# Patient Record
Sex: Male | Born: 1985 | Race: Black or African American | Hispanic: No | Marital: Single | Smoking: Former smoker
Health system: Southern US, Community
[De-identification: ages and names within clinical notes are randomized; demographics above are authoritative.]

## PROBLEM LIST (undated history)

## (undated) DIAGNOSIS — J9383 Other pneumothorax: Secondary | ICD-10-CM

## (undated) HISTORY — DX: Other pneumothorax: J93.83

---
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
## 2007-09-13 ENCOUNTER — Emergency Department: Payer: Self-pay

## 2009-03-10 ENCOUNTER — Emergency Department: Payer: Self-pay | Admitting: Emergency Medicine

## 2009-03-18 ENCOUNTER — Emergency Department: Payer: Self-pay | Admitting: Emergency Medicine

## 2014-01-14 ENCOUNTER — Emergency Department: Payer: Self-pay | Admitting: Emergency Medicine

## 2017-01-17 ENCOUNTER — Emergency Department: Payer: Self-pay

## 2017-01-17 ENCOUNTER — Observation Stay: Payer: Self-pay

## 2017-01-17 ENCOUNTER — Observation Stay
Admission: EM | Admit: 2017-01-17 | Discharge: 2017-01-18 | Disposition: A | Discharge status: Home / Self Care | Payer: Self-pay | Attending: Cardiothoracic Surgery | Admitting: Cardiothoracic Surgery

## 2017-01-17 DIAGNOSIS — J9383 Other pneumothorax: Secondary | ICD-10-CM | POA: Diagnosis present

## 2017-01-17 DIAGNOSIS — J9311 Primary spontaneous pneumothorax: Principal | ICD-10-CM | POA: Insufficient documentation

## 2017-01-17 DIAGNOSIS — F17210 Nicotine dependence, cigarettes, uncomplicated: Secondary | ICD-10-CM | POA: Insufficient documentation

## 2017-01-17 DIAGNOSIS — Z09 Encounter for follow-up examination after completed treatment for conditions other than malignant neoplasm: Secondary | ICD-10-CM

## 2017-01-17 LAB — BASIC METABOLIC PANEL
Anion gap: 6 (ref 5–15)
BUN: 12 mg/dL (ref 6–20)
CALCIUM: 9.2 mg/dL (ref 8.9–10.3)
CO2: 29 mmol/L (ref 22–32)
CREATININE: 1.37 mg/dL — AB (ref 0.61–1.24)
Chloride: 104 mmol/L (ref 101–111)
GFR calc non Af Amer: >60 mL/min (ref >60)
Glucose, Bld: 104 mg/dL — ABNORMAL HIGH (ref 65–99)
Potassium: 4.1 mmol/L (ref 3.5–5.1)
SODIUM: 139 mmol/L (ref 135–145)

## 2017-01-17 LAB — CBC WITH DIFFERENTIAL/PLATELET
BASOS ABS: 0 10*3/uL (ref 0–0.1)
BASOS PCT: 1 %
EOS ABS: 0 10*3/uL (ref 0–0.7)
Eosinophils Relative: 0 %
HCT: 43.7 % (ref 40.0–52.0)
Hemoglobin: 14.9 g/dL (ref 13.0–18.0)
Lymphocytes Relative: 34 %
Lymphs Abs: 2 10*3/uL (ref 1.0–3.6)
MCH: 27.6 pg (ref 26.0–34.0)
MCHC: 34 g/dL (ref 32.0–36.0)
MCV: 81.3 fL (ref 80.0–100.0)
MONO ABS: 0.4 10*3/uL (ref 0.2–1.0)
MONOS PCT: 6 %
NEUTROS ABS: 3.4 10*3/uL (ref 1.4–6.5)
Neutrophils Relative %: 59 %
PLATELETS: 204 10*3/uL (ref 150–440)
RBC: 5.37 MIL/uL (ref 4.40–5.90)
RDW: 12.6 % (ref 11.5–14.5)
WBC: 5.8 10*3/uL (ref 3.8–10.6)

## 2017-01-17 LAB — TROPONIN I

## 2017-01-17 MED ORDER — ACETAMINOPHEN 500 MG PO TABS
1000.0000 mg | ORAL_TABLET | Freq: Four times a day (QID) | ORAL | Status: DC | PRN
  Administered 2017-01-17: 1000 mg via ORAL
  Filled 2017-01-17: qty 2

## 2017-01-17 MED ORDER — HYDROCODONE-ACETAMINOPHEN 5-325 MG PO TABS
1.0000 | ORAL_TABLET | ORAL | Status: DC | PRN
  Filled 2017-01-17: qty 1

## 2017-01-17 MED ORDER — ACETAMINOPHEN 500 MG PO TABS
1000.0000 mg | ORAL_TABLET | Freq: Once | ORAL | Status: AC
  Administered 2017-01-17: 1000 mg via ORAL
  Filled 2017-01-17: qty 2

## 2017-01-17 MED ORDER — KETOROLAC TROMETHAMINE 30 MG/ML IJ SOLN
15.0000 mg | Freq: Once | INTRAMUSCULAR | Status: AC
  Administered 2017-01-17: 15 mg via INTRAVENOUS
  Filled 2017-01-17: qty 1

## 2017-01-17 NOTE — ED Triage Notes (Signed)
Pt c/o left side chest pain since yesterday, states worse with movement and deep breathing, states he did lift and carry heavy coolers to the pool on Sunday..Marland Kitchen

## 2017-01-17 NOTE — ED Notes (Signed)
Pt family brought pt chickfila. Pt denies any needs at this time.

## 2017-01-17 NOTE — ED Notes (Signed)
Admitting at bedside 

## 2017-01-17 NOTE — ED Notes (Signed)
Pt returned from xray

## 2017-01-17 NOTE — ED Notes (Addendum)
Pt to ed with c/o chest pain that started yesterday. Pt reports pain is constant worse with movement and deep breath, left side of chest, mild sob, mild radiation to back.  MD at bedside.  Pt alert and oriented, on CM, IV started labs drawn and sent.  Pt skin warm and dry.  Rates pain 6/10 sitting on stretcher.  Pt sr on monitor.  Lungs clear to auscultation bilat.

## 2017-01-17 NOTE — ED Notes (Signed)
Pt taken to xray via stretcher  

## 2017-01-17 NOTE — ED Provider Notes (Signed)
[facility] Emergency Department Provider Note  ____________________________________________  Time seen: Approximately 9:43 AM  I have reviewed the triage vital signs and the nursing notes.   HISTORY  Chief Complaint Chest Pain   HPI Joel Garner is a 31 y.o. male no significant past medical history who presents for evaluation of left-sided chest pain. Patient reports 2 days ago he was carrying heavy coolers to and from the pool. Yesterday he woke up with left-sided chest pain that he describes as a muscle soreness, mild at rest and worse with movement of his chest wall palpation, constant and nonradiating. No shortness of breath, no URI symptoms, no fever, no dizziness, no nausea or vomiting, no abdominal pain. No personal or family history blood clots, no personal or family history of ischemic heart disease. No recent travel or immobilization, no leg pain or swelling, no hormones. Patient is a smoker. Patient uses marijuana. Patient endorses 1 beer every other day. No other drugs. Has never had a stress test.  No past medical history on file.  Patient Active Problem List   Diagnosis Date Noted  . Spontaneous pneumothorax 01/17/2017    History reviewed. No pertinent surgical history.  Prior to Admission medications   Not on File    Allergies Patient has no known allergies.  No family history on file.  Social History Social History  Substance Use Topics  . Smoking status: Current Every Day Smoker  . Smokeless tobacco: Never Used  . Alcohol use Yes    Review of Systems  Constitutional: Negative for fever. Eyes: Negative for visual changes. ENT: Negative for sore throat. Neck: No neck pain  Cardiovascular: + chest pain. Respiratory: Negative for shortness of breath. Gastrointestinal: Negative for abdominal pain, vomiting or diarrhea. Genitourinary: Negative for dysuria. Musculoskeletal: Negative for back pain. Skin: Negative for  rash. Neurological: Negative for headaches, weakness or numbness. Psych: No SI or HI  ____________________________________________   PHYSICAL EXAM:  VITAL SIGNS: ED Triage Vitals  Enc Vitals Group     BP 01/17/17 0910 136/82     Pulse Rate 01/17/17 0910 81     Resp 01/17/17 0910 17     Temp 01/17/17 0910 98.6 F (37 C)     Temp Source 01/17/17 0910 Oral     SpO2 01/17/17 0910 99 %     Weight 01/17/17 0910 210 lb (95.3 kg)     Height 01/17/17 0910 6\' 1"  (1.854 m)     Head Circumference --      Peak Flow --      Pain Score 01/17/17 0909 7     Pain Loc --      Pain Edu? --      Excl. in GC? --     Constitutional: Alert and oriented. Well appearing and in no apparent distress. HEENT:      Head: Normocephalic and atraumatic.         Eyes: Conjunctivae are normal. Sclera is non-icteric.       Mouth/Throat: Mucous membranes are moist.       Neck: Supple with no signs of meningismus. Cardiovascular: Regular rate and rhythm. No murmurs, gallops, or rubs. 2+ symmetrical distal pulses are present in all extremities. No JVD. ttp over the L lower chest wall with palpation which reproduces the pain Respiratory: Normal respiratory effort. Lungs are clear to auscultation bilaterally. No wheezes, crackles, or rhonchi.  Gastrointestinal: Soft, non tender, and non distended with positive bowel sounds. No rebound or guarding. Genitourinary:  No CVA tenderness. Musculoskeletal: Nontender with normal range of motion in all extremities. No edema, cyanosis, or erythema of extremities. Neurologic: Normal speech and language. Face is symmetric. Moving all extremities. No gross focal neurologic deficits are appreciated. Skin: Skin is warm, dry and intact. No rash noted. Psychiatric: Mood and affect are normal. Speech and behavior are normal.  ____________________________________________   LABS (all labs ordered are listed, but only abnormal results are displayed)  Labs Reviewed  BASIC METABOLIC  PANEL - Abnormal; Notable for the following:       Result Value   Glucose, Bld 104 (*)    Creatinine, Ser 1.37 (*)    All other components within normal limits  CBC WITH DIFFERENTIAL/PLATELET  TROPONIN I  HIV ANTIBODY (ROUTINE TESTING)   ____________________________________________  EKG  ED ECG REPORT I, Nita Sicklearolina Jordell Outten, the attending physician, personally viewed and interpreted this ECG.  Normal sinus rhythm, rate of 83, normal intervals, normal axis, no ST elevations or depressions, flattening in aVL. No prior for comparison  ____________________________________________  RADIOLOGY  CXR: Left apical and apicolateral pneumothorax without tension component. No edema or consolidation. Cardiac silhouette within normal limits. ____________________________________________   PROCEDURES  Procedure(s) performed: None Procedures Critical Care performed:  None ____________________________________________   INITIAL IMPRESSION / ASSESSMENT AND PLAN / ED COURSE   31 y.o. male no significant past medical history who presents for evaluation of left-sided chest pain that he describes as muscle soreness, worse with palpation of the chest wall or movement of the chest wall that has been present for the last 24 hours constantly in the setting of carrying heavy coolers today before to and from the pool. Presentation for atypical chest pain which is reproducible on palpation. Heart score of 1 for smoking. EKG with no ischemic changes. PERC negative. We'll send patient for chest x-ray and get troponin 1 since pain has been constant for a day. This sounds to me like MSK pain based on history and exam therefore will treat with Toradol and Tylenol. Also in the ddx PTX, PNA (although no cough or fever).    _________________________ 9:59 AM on 01/17/2017 -----------------------------------------  Chest x-ray concerning for 15% pneumothorax on the left with no tension component. Patient is  hemodynamically stable, satting 98% on room air in no respiratory distress. At this time we'll attempt oxygen and repeat chest x-ray in 3 hours. The patient becomes distressed or PTX worsens in repeat CXR will put the chest tube.  Pertinent labs & imaging results that were available during my care of the patient were reviewed by me and considered in my medical decision making (see chart for details).    ____________________________________________   FINAL CLINICAL IMPRESSION(S) / ED DIAGNOSES  Final diagnoses:  Primary spontaneous pneumothorax      NEW MEDICATIONS STARTED DURING THIS VISIT:  There are no discharge medications for this patient.    Note:  This document was prepared using Dragon voice recognition software and may include unintentional dictation errors.    Don PerkingVeronese, [place], MD 01/17/17 [phone]

## 2017-01-17 NOTE — H&P (Signed)
Patient ID: Joel Garner, male   DOB: June 11, 1986, 31 y.o.   MRN: [id number]  Chief Complaint  Patient presents with  . Chest Pain    HPI Joel Garner is a 31 y.o. male.  He states that yesterday morning he awoke from sleep and felt an uncomfortable feeling in his left chest. He describes this as a burning sensation that he thought might of been associated with some reflux. The pain wasn't significant and he was able to work through it throughout the day. Yesterday evening he went to bed but when he awoke this morning he again felt the discomfort in his left chest. He went to work. While at work thought he should come to the emergency room to have it checked. Upon arrival here he had a chest x-ray made which showed a small pneumothorax. A repeat chest x-ray made several hours later showed no change in the size of the pneumothorax. I was asked to see the patient for admission for observation for management. He currently states that he feels quite well. He is relieved of the diagnosis and believes that this may been responsible for some of his pain. He's not really short of breath at this time. He has never had this experience before and there is no family history of any lung disease. He does not take any medications and has no allergies. He does smoke a pack cigarettes every 3 days. He also smokes marijuana on a daily basis.     No past medical history on file.  History reviewed. No pertinent surgical history.  No family history on file.  Social History Social History  Substance Use Topics  . Smoking status: Current Every Day Smoker  . Smokeless tobacco: Never Used  . Alcohol use Yes    No Known Allergies  No current facility-administered medications for this encounter.    No current outpatient prescriptions on file.     A complete review of systems was asked and was negative except for the following positive findingsChest pain  Blood pressure 124/79, pulse 60, temperature 98.6 F  (37 C), temperature source Oral, resp. rate 20, height 6\' 1"  (1.854 m), weight 210 lb (95.3 kg), SpO2 100 %.  Physical Exam CONSTITUTIONAL:  Pleasant, well-developed, well-nourished, and in no acute distress. EYES: Pupils equal and reactive to light, Sclera non-icteric EARS, NOSE, MOUTH AND THROAT:  The oropharynx was clear.  Dentition is good repair.  Oral mucosa pink and moist. LYMPH NODES:  Lymph nodes in the neck and axillae were normal RESPIRATORY:  Lungs were clear bilaterally but slightly diminished on the left..  Normal respiratory effort without pathologic use of accessory muscles of respiration CARDIOVASCULAR: Heart was regular without murmurs.  There were no carotid bruits. GI: The abdomen was soft, nontender, and nondistended. There were no palpable masses. There was no hepatosplenomegaly. There were normal bowel sounds in all quadrants. GU:   MUSCULOSKELETAL:  Normal muscle strength and tone.  No clubbing or cyanosis.   SKIN:  There were no pathologic skin lesions.  There were no nodules on palpation. NEUROLOGIC:  Sensation is normal.  Cranial nerves are grossly intact. PSYCH:  Oriented to person, place and time.  Mood and affect are normal.  Data Reviewed Chest x-rays  I have personally reviewed the patient's imaging and medical records.    Assessment    Left sided spontaneous pneumothorax. I reviewed his films with Dr. Maisie Fus register in interventional radiology. There does not appear to be any significant difference between  the 2 films that were obtained. I reviewed with the patient and his father the indications and risks of chest tube insertion versus observation only. They would like to observe at this time.    Plan    We will admit the patient to the hospital for observation. He will have another chest x-ray made later this evening. We will keep him on oxygen therapy.      Hulda Marinimothy Dimond Crotty, MD 01/17/2017, 1:22 PM

## 2017-01-18 ENCOUNTER — Observation Stay: Payer: Self-pay

## 2017-01-18 ENCOUNTER — Telehealth: Payer: Self-pay

## 2017-01-18 ENCOUNTER — Other Ambulatory Visit: Payer: Self-pay

## 2017-01-18 DIAGNOSIS — J9383 Other pneumothorax: Secondary | ICD-10-CM

## 2017-01-18 NOTE — Telephone Encounter (Signed)
Patients father called back, I relayed this information to his father for him to give the information to his son.

## 2017-01-18 NOTE — Care Management (Signed)
Patient admitted with spontaneous left sided pneumothorax.  Patient listed as self pay patient.  Patient states that he does have insurance through his employer, but does not have his card with him.  Patient to [facility] with insurance information to add to file.   No new prescriptions on file for patient at discharge.  PCP Fisher.  No RNCM needs identified.  RNCM signing off

## 2017-01-18 NOTE — Progress Notes (Signed)
MD ordered patient to be discharged home.  Discharge instructions were reviewed with the patient and he voiced understanding.  Follow-up appointment was made.  No prescriptions given to the patient.  IV was removed with catheter intact.  All patients questions were answered.  Patient leaving via wheelchair escorted by auxillary.

## 2017-01-18 NOTE — Final Progress Note (Signed)
  Patient ID: Joel Garner, male   DOB: 27-Jan-1986, 31 y.o.   MRN: [id number]  HISTORY: Minimal discomfort.  Not short of breath.  Oxygen sats 100 %.     Vitals:   01/18/17 0406 01/18/17 0910  BP: (!) 146/81   Pulse: 69 62  Resp: 20   Temp: 98 F (36.7 C)      EXAM:    Resp: Lungs are clear bilaterally.  No respiratory distress, normal effort. Heart:  Regular without murmurs Abd:  Abdomen is soft, non distended and non tender. No masses are palpable.  There is no rebound and no guarding.  Neurological: Alert and oriented to person, place, and time. Coordination normal.  Skin: Skin is warm and dry. No rash noted. No diaphoretic. No erythema. No pallor.  Psychiatric: Normal mood and affect. Normal behavior. Judgment and thought content normal.   Independent review of today's CXRay shows stable to slightly smaller left hydropneumothorax     ASSESSMENT: Left sided spontaneous pneumothorax   PLAN:   Discharge to home today.  No heavy lifting or driving.  Followup in 2 days in the office.  Other discharge instructions given as well.      Hulda Marinimothy Anthony Roland, MD

## 2017-01-18 NOTE — Discharge Summary (Signed)
Physician Discharge Summary  Patient ID: Junious Silkrvin P Koke MRN: [id number] DOB/AGE: 1985-11-09 31 y.o.  Admit date: 01/17/2017 Discharge date: 01/18/2017   Discharge Diagnoses:  Active Problems:   Spontaneous pneumothorax   Procedures: None  Hospital Course: Patient was admitted with a spontaneous left sided pneumothorax.  Serial CXRay and exams were stable.  He was asymptomatic the morning after admission and his CXRay was stable to imporved.  He was discharged to home to followup in 2 days as an outpatient.    Disposition: Final discharge disposition not confirmed  Discharge Instructions    Diet - low sodium heart healthy    Complete by:  As directed    Discharge instructions    Complete by:  As directed    Followup in 2 days.  No lifting greater than 10 pounds and no driving.  Tylenol or Advil for pain.  Return to ER if symptoms develop or worsen.   Increase activity slowly    Complete by:  As directed      Allergies as of 01/18/2017   No Known Allergies     Medication List    You have not been prescribed any medications.      Hulda Marinimothy Dallas Torok, MD

## 2017-01-18 NOTE — Telephone Encounter (Signed)
Called patient and his voicemail is not set-up. I then called patient's father and left him a detailed message to tell his son that we are needing to see him on Friday 01/20/2017 at 8:00 AM with Dr. Thelma Bargeaks. But before that, [facility] to have a chest x-ray done.

## 2017-01-19 LAB — HIV ANTIBODY (ROUTINE TESTING W REFLEX): HIV SCREEN 4TH GENERATION: NONREACTIVE

## 2017-01-20 ENCOUNTER — Encounter: Payer: Self-pay | Admitting: Cardiothoracic Surgery

## 2017-01-20 ENCOUNTER — Ambulatory Visit (INDEPENDENT_AMBULATORY_CARE_PROVIDER_SITE_OTHER): Payer: Self-pay | Admitting: Cardiothoracic Surgery

## 2017-01-20 ENCOUNTER — Ambulatory Visit
Admission: RE | Admit: 2017-01-20 | Discharge: 2017-01-20 | Disposition: A | Discharge status: Home / Self Care | Payer: Self-pay | Source: Ambulatory Visit | Attending: Cardiothoracic Surgery | Admitting: Cardiothoracic Surgery

## 2017-01-20 VITALS — BP 152/80 | HR 74 | Temp 97.7°F | Ht 73.0 in | Wt 213.0 lb

## 2017-01-20 DIAGNOSIS — J939 Pneumothorax, unspecified: Secondary | ICD-10-CM | POA: Insufficient documentation

## 2017-01-20 DIAGNOSIS — J9383 Other pneumothorax: Secondary | ICD-10-CM

## 2017-01-20 DIAGNOSIS — J90 Pleural effusion, not elsewhere classified: Secondary | ICD-10-CM | POA: Insufficient documentation

## 2017-01-20 NOTE — Patient Instructions (Signed)
Please remember not to work at this time until Dr. Thelma Bargeaks releases you.

## 2017-01-20 NOTE — Progress Notes (Signed)
  Patient ID: Joel Garner, male   DOB: 01/05/1986, 31 y.o.   MRN: [id number]  HISTORY: He returns today in follow-up. He was admitted to the hospital for observation following a left spontaneous pneumothorax. He's been using his incentive spirometer without difficulty. He denies any significant pain or shortness of breath. He denies any fevers. He has quit smoking and is now 4 days since his last cigarette.   Vitals:   01/20/17 0810  BP: (!) 152/80  Pulse: 74  Temp: 97.7 F (36.5 C)     EXAM:    Resp: Lungs are clear bilaterally But slightly diminished on the left..  No respiratory distress, normal effort. Heart:  Regular without murmurs Abd:  Abdomen is soft, non distended and non tender. No masses are palpable.  There is no rebound and no guarding.  Neurological: Alert and oriented to person, place, and time. Coordination normal.  Skin: Skin is warm and dry. No rash noted. No diaphoretic. No erythema. No pallor.  Psychiatric: Normal mood and affect. Normal behavior. Judgment and thought content normal.    ASSESSMENT: Left sided spontaneous pneumothorax. I have independently reviewed the patient's films. I do think that the pneumothorax is slightly smaller than upon admission.   PLAN:   I will see the patient back again in one week. We will obtain a chest x-ray at that time. He was instructed to remain tobacco free.    Hulda Marinimothy Maverik Foot, MD

## 2017-01-24 ENCOUNTER — Telehealth: Payer: Self-pay

## 2017-01-24 NOTE — Telephone Encounter (Signed)
Patient stated he received disability paperwork from his employer and that he would bring it here to be completed. Patient advised of 25.00 dollar fee.

## 2017-01-27 ENCOUNTER — Ambulatory Visit (INDEPENDENT_AMBULATORY_CARE_PROVIDER_SITE_OTHER): Payer: Self-pay | Admitting: Cardiothoracic Surgery

## 2017-01-27 ENCOUNTER — Ambulatory Visit
Admission: RE | Admit: 2017-01-27 | Discharge: 2017-01-27 | Disposition: A | Discharge status: Home / Self Care | Payer: Self-pay | Source: Ambulatory Visit | Attending: Cardiothoracic Surgery | Admitting: Cardiothoracic Surgery

## 2017-01-27 ENCOUNTER — Encounter: Payer: Self-pay | Admitting: Cardiothoracic Surgery

## 2017-01-27 VITALS — BP 119/82 | HR 89 | Temp 98.4°F | Resp 14 | Ht 73.0 in | Wt 211.0 lb

## 2017-01-27 DIAGNOSIS — J9383 Other pneumothorax: Secondary | ICD-10-CM

## 2017-01-27 NOTE — Progress Notes (Signed)
  Patient ID: Joel Garner, male   DOB: 09-03-1985, 31 y.o.   MRN: [id number]  HISTORY: He is done well since discharge. His had no complaints of fever or chills. There is been no cough or shortness of breath. He has no chest pain.   Vitals:   01/27/17 0831  BP: 119/82  Pulse: 89  Resp: 14  Temp: 98.4 F (36.9 C)     EXAM:    Resp: Lungs are clear bilaterally.  No respiratory distress, normal effort. Heart:  Regular without murmurs Abd:  Abdomen is soft, non distended and non tender. No masses are palpable.  There is no rebound and no guarding.  Neurological: Alert and oriented to person, place, and time. Coordination normal.  Skin: Skin is warm and dry. No rash noted. No diaphoretic. No erythema. No pallor.  Psychiatric: Normal mood and affect. Normal behavior. Judgment and thought content normal.    ASSESSMENT: Spontaneous pneumothorax left side. I have independently reviewed his chest x-ray. I see no abnormalities on that. The pneumothorax has resolved.   PLAN:   I have explained to the patient the need for tobacco cessation. He understands that.  In addition he knows that there is an approximately 10% risk of a further pneumothorax. He's aware those some now that he has experienced 1 he will know what to look for regarding symptoms. I told him that he could return to work next week. I did not make a return visit form. He will follow-up when necessary.    Hulda Marinimothy Laquita Harlan, MD

## 2017-01-27 NOTE — Patient Instructions (Signed)
Give our office a call if you need to schedule an appointment.

## 2019-03-28 ENCOUNTER — Encounter: Payer: Self-pay | Admitting: Emergency Medicine

## 2019-03-28 ENCOUNTER — Emergency Department: Payer: Self-pay

## 2019-03-28 ENCOUNTER — Emergency Department
Admission: EM | Admit: 2019-03-28 | Discharge: 2019-03-28 | Disposition: A | Discharge status: Home / Self Care | Payer: Self-pay | Attending: Emergency Medicine | Admitting: Emergency Medicine

## 2019-03-28 ENCOUNTER — Encounter (HOSPITAL_COMMUNITY): Payer: Self-pay | Admitting: Emergency Medicine

## 2019-03-28 ENCOUNTER — Other Ambulatory Visit: Payer: Self-pay

## 2019-03-28 ENCOUNTER — Inpatient Hospital Stay (HOSPITAL_COMMUNITY)
Admission: EM | Admit: 2019-03-28 | Discharge: 2019-03-30 | DRG: 494 | Disposition: A | Discharge status: Home / Self Care | Payer: Self-pay | Source: Other Acute Inpatient Hospital | Attending: Orthopedic Surgery | Admitting: Orthopedic Surgery

## 2019-03-28 DIAGNOSIS — Z7289 Other problems related to lifestyle: Secondary | ICD-10-CM | POA: Diagnosis not present

## 2019-03-28 DIAGNOSIS — T148XXA Other injury of unspecified body region, initial encounter: Secondary | ICD-10-CM

## 2019-03-28 DIAGNOSIS — Z87891 Personal history of nicotine dependence: Secondary | ICD-10-CM | POA: Insufficient documentation

## 2019-03-28 DIAGNOSIS — S82871A Displaced pilon fracture of right tibia, initial encounter for closed fracture: Secondary | ICD-10-CM | POA: Diagnosis present

## 2019-03-28 DIAGNOSIS — Z20828 Contact with and (suspected) exposure to other viral communicable diseases: Secondary | ICD-10-CM | POA: Insufficient documentation

## 2019-03-28 DIAGNOSIS — Z419 Encounter for procedure for purposes other than remedying health state, unspecified: Secondary | ICD-10-CM

## 2019-03-28 DIAGNOSIS — Y92828 Other wilderness area as the place of occurrence of the external cause: Secondary | ICD-10-CM | POA: Insufficient documentation

## 2019-03-28 DIAGNOSIS — S92121A Displaced fracture of body of right talus, initial encounter for closed fracture: Secondary | ICD-10-CM | POA: Insufficient documentation

## 2019-03-28 DIAGNOSIS — X509XXA Other and unspecified overexertion or strenuous movements or postures, initial encounter: Secondary | ICD-10-CM | POA: Insufficient documentation

## 2019-03-28 DIAGNOSIS — Y9389 Activity, other specified: Secondary | ICD-10-CM | POA: Insufficient documentation

## 2019-03-28 DIAGNOSIS — Y905 Blood alcohol level of 100-119 mg/100 ml: Secondary | ICD-10-CM | POA: Insufficient documentation

## 2019-03-28 DIAGNOSIS — Y998 Other external cause status: Secondary | ICD-10-CM | POA: Insufficient documentation

## 2019-03-28 DIAGNOSIS — S82851A Displaced trimalleolar fracture of right lower leg, initial encounter for closed fracture: Secondary | ICD-10-CM | POA: Insufficient documentation

## 2019-03-28 DIAGNOSIS — Z8709 Personal history of other diseases of the respiratory system: Secondary | ICD-10-CM

## 2019-03-28 DIAGNOSIS — Y929 Unspecified place or not applicable: Secondary | ICD-10-CM

## 2019-03-28 DIAGNOSIS — F10920 Alcohol use, unspecified with intoxication, uncomplicated: Secondary | ICD-10-CM | POA: Insufficient documentation

## 2019-03-28 LAB — BASIC METABOLIC PANEL
Anion gap: 10 (ref 5–15)
BUN: 9 mg/dL (ref 6–20)
CO2: 26 mmol/L (ref 22–32)
Calcium: 9.3 mg/dL (ref 8.9–10.3)
Chloride: 104 mmol/L (ref 98–111)
Creatinine, Ser: 1.17 mg/dL (ref 0.61–1.24)
GFR calc Af Amer: >60 mL/min (ref >60)
GFR calc non Af Amer: >60 mL/min (ref >60)
Glucose, Bld: 99 mg/dL (ref 70–99)
Potassium: 3.6 mmol/L (ref 3.5–5.1)
Sodium: 140 mmol/L (ref 135–145)

## 2019-03-28 LAB — CBC WITH DIFFERENTIAL/PLATELET
Abs Immature Granulocytes: 0.02 10*3/uL (ref 0.00–0.07)
Basophils Absolute: 0 10*3/uL (ref 0.0–0.1)
Basophils Relative: 0 %
Eosinophils Absolute: 0 10*3/uL (ref 0.0–0.5)
Eosinophils Relative: 0 %
HCT: 43.2 % (ref 39.0–52.0)
Hemoglobin: 14 g/dL (ref 13.0–17.0)
Immature Granulocytes: 0 %
Lymphocytes Relative: 53 %
Lymphs Abs: 4 10*3/uL (ref 0.7–4.0)
MCH: 27.2 pg (ref 26.0–34.0)
MCHC: 32.4 g/dL (ref 30.0–36.0)
MCV: 84 fL (ref 80.0–100.0)
Monocytes Absolute: 0.5 10*3/uL (ref 0.1–1.0)
Monocytes Relative: 7 %
Neutro Abs: 3.1 10*3/uL (ref 1.7–7.7)
Neutrophils Relative %: 40 %
Platelets: 261 10*3/uL (ref 150–400)
RBC: 5.14 MIL/uL (ref 4.22–5.81)
RDW: 12.5 % (ref 11.5–15.5)
WBC: 7.6 10*3/uL (ref 4.0–10.5)
nRBC: 0 % (ref 0.0–0.2)

## 2019-03-28 LAB — ETHANOL: Alcohol, Ethyl (B): 109 mg/dL — ABNORMAL HIGH (ref <10)

## 2019-03-28 LAB — SARS CORONAVIRUS 2 BY RT PCR (HOSPITAL ORDER, PERFORMED IN ~~LOC~~ HOSPITAL LAB): SARS Coronavirus 2: NEGATIVE

## 2019-03-28 MED ORDER — ONDANSETRON HCL 4 MG PO TABS: 4.0000 mg | ORAL_TABLET | Freq: Four times a day (QID) | ORAL | Status: DC | PRN

## 2019-03-28 MED ORDER — HYDROMORPHONE HCL 1 MG/ML IJ SOLN
1.0000 mg | Freq: Once | INTRAMUSCULAR | Status: AC
  Administered 2019-03-28: 1 mg via INTRAVENOUS
  Filled 2019-03-28: qty 1

## 2019-03-28 MED ORDER — ACETAMINOPHEN 325 MG PO TABS: 325.0000 mg | ORAL_TABLET | Freq: Four times a day (QID) | ORAL | Status: DC | PRN

## 2019-03-28 MED ORDER — HYDROCODONE-ACETAMINOPHEN 5-325 MG PO TABS: 1.0000 | ORAL_TABLET | ORAL | Status: DC | PRN

## 2019-03-28 MED ORDER — HYDROMORPHONE HCL 1 MG/ML IJ SOLN
INTRAMUSCULAR | Status: AC
  Administered 2019-03-28: 19:00:00
  Filled 2019-03-28: qty 1

## 2019-03-28 MED ORDER — METHOCARBAMOL 500 MG PO TABS: 500.0000 mg | ORAL_TABLET | Freq: Four times a day (QID) | ORAL | Status: DC | PRN

## 2019-03-28 MED ORDER — HYDROCODONE-ACETAMINOPHEN 7.5-325 MG PO TABS: 1.0000 | ORAL_TABLET | ORAL | Status: DC | PRN

## 2019-03-28 MED ORDER — TRAMADOL HCL 50 MG PO TABS
50.0000 mg | ORAL_TABLET | Freq: Four times a day (QID) | ORAL | Status: DC
  Administered 2019-03-28: 50 mg via ORAL
  Filled 2019-03-28: qty 1

## 2019-03-28 MED ORDER — HYDROMORPHONE HCL 1 MG/ML IJ SOLN: 1.0000 mg | Freq: Once | INTRAMUSCULAR | Status: DC

## 2019-03-28 MED ORDER — MORPHINE SULFATE (PF) 2 MG/ML IV SOLN
2.0000 mg | INTRAVENOUS | Status: DC | PRN
  Administered 2019-03-29 (×2): 2 mg via INTRAVENOUS
  Filled 2019-03-28 (×2): qty 1

## 2019-03-28 MED ORDER — HYDROMORPHONE HCL 1 MG/ML PO LIQD: 1.0000 mg | Freq: Once | ORAL | Status: DC

## 2019-03-28 MED ORDER — ONDANSETRON HCL 4 MG/2ML IJ SOLN: 4.0000 mg | Freq: Four times a day (QID) | INTRAMUSCULAR | Status: DC | PRN

## 2019-03-28 MED ORDER — ACETAMINOPHEN 500 MG PO TABS
500.0000 mg | ORAL_TABLET | Freq: Four times a day (QID) | ORAL | Status: DC
  Administered 2019-03-28: 500 mg via ORAL
  Filled 2019-03-28: qty 1

## 2019-03-28 MED ORDER — METHOCARBAMOL 1000 MG/10ML IJ SOLN: 500.0000 mg | Freq: Four times a day (QID) | INTRAVENOUS | Status: DC | PRN

## 2019-03-28 NOTE — ED Notes (Signed)
Patient transported to CT 
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
## 2019-03-28 NOTE — ED Notes (Signed)
Patient transferring to [facility]; called report to [facility]. ED tech applying splint
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
## 2019-03-29 ENCOUNTER — Encounter (HOSPITAL_COMMUNITY)
Admission: EM | Disposition: A | Discharge status: Home / Self Care | Payer: Self-pay | Source: Home / Self Care | Attending: Orthopedic Surgery

## 2019-03-29 ENCOUNTER — Inpatient Hospital Stay (HOSPITAL_COMMUNITY): Payer: Self-pay | Admitting: Certified Registered"

## 2019-03-29 ENCOUNTER — Encounter (HOSPITAL_COMMUNITY): Payer: Self-pay | Admitting: Surgery

## 2019-03-29 ENCOUNTER — Inpatient Hospital Stay (HOSPITAL_COMMUNITY): Payer: Self-pay

## 2019-03-29 DIAGNOSIS — S82871A Displaced pilon fracture of right tibia, initial encounter for closed fracture: Secondary | ICD-10-CM | POA: Diagnosis present

## 2019-03-29 HISTORY — PX: ORIF ANKLE FRACTURE: SHX5408

## 2019-03-29 LAB — HIV ANTIBODY (ROUTINE TESTING W REFLEX): HIV Screen 4th Generation wRfx: NONREACTIVE

## 2019-03-29 LAB — CBC
HCT: 41.9 % (ref 39.0–52.0)
Hemoglobin: 13.9 g/dL (ref 13.0–17.0)
MCH: 27.9 pg (ref 26.0–34.0)
MCHC: 33.2 g/dL (ref 30.0–36.0)
MCV: 84 fL (ref 80.0–100.0)
Platelets: 231 10*3/uL (ref 150–400)
RBC: 4.99 MIL/uL (ref 4.22–5.81)
RDW: 12.5 % (ref 11.5–15.5)
WBC: 13.8 10*3/uL — ABNORMAL HIGH (ref 4.0–10.5)
nRBC: 0 % (ref 0.0–0.2)

## 2019-03-29 LAB — SURGICAL PCR SCREEN
MRSA, PCR: NEGATIVE
Staphylococcus aureus: NEGATIVE

## 2019-03-29 LAB — CREATININE, SERUM
Creatinine, Ser: 1.13 mg/dL (ref 0.61–1.24)
GFR calc Af Amer: >60 mL/min (ref >60)
GFR calc non Af Amer: >60 mL/min (ref >60)

## 2019-03-29 SURGERY — OPEN REDUCTION INTERNAL FIXATION (ORIF) ANKLE FRACTURE
Anesthesia: General | Site: Ankle | Laterality: Right

## 2019-03-29 MED ORDER — OXYCODONE HCL 5 MG/5ML PO SOLN: 5.0000 mg | Freq: Once | ORAL | Status: AC | PRN

## 2019-03-29 MED ORDER — ONDANSETRON HCL 4 MG/2ML IJ SOLN
INTRAMUSCULAR | Status: AC
  Filled 2019-03-29: qty 2

## 2019-03-29 MED ORDER — ONDANSETRON HCL 4 MG/2ML IJ SOLN: 4.0000 mg | Freq: Once | INTRAMUSCULAR | Status: DC | PRN

## 2019-03-29 MED ORDER — FENTANYL CITRATE (PF) 100 MCG/2ML IJ SOLN: 25.0000 ug | INTRAMUSCULAR | Status: DC | PRN

## 2019-03-29 MED ORDER — ACETAMINOPHEN 10 MG/ML IV SOLN
1000.0000 mg | Freq: Once | INTRAVENOUS | Status: DC | PRN
  Administered 2019-03-29: 14:00:00 1000 mg via INTRAVENOUS

## 2019-03-29 MED ORDER — VITAMIN D3 25 MCG PO TABS: 1000.0000 [IU] | ORAL_TABLET | Freq: Two times a day (BID) | ORAL | 0 refills | Status: DC

## 2019-03-29 MED ORDER — OXYCODONE HCL 5 MG PO TABS
5.0000 mg | ORAL_TABLET | Freq: Once | ORAL | Status: AC | PRN
  Administered 2019-03-29: 5 mg via ORAL

## 2019-03-29 MED ORDER — MIDAZOLAM HCL 2 MG/2ML IJ SOLN
INTRAMUSCULAR | Status: AC
  Filled 2019-03-29: qty 2

## 2019-03-29 MED ORDER — HYDROMORPHONE HCL 1 MG/ML IJ SOLN: 0.5000 mg | INTRAMUSCULAR | Status: DC | PRN

## 2019-03-29 MED ORDER — CEFAZOLIN SODIUM-DEXTROSE 2-4 GM/100ML-% IV SOLN
2.0000 g | Freq: Four times a day (QID) | INTRAVENOUS | Status: AC
  Administered 2019-03-29 – 2019-03-30 (×3): 2 g via INTRAVENOUS
  Filled 2019-03-29 (×3): qty 100

## 2019-03-29 MED ORDER — VITAMIN D 25 MCG (1000 UNIT) PO TABS
2000.0000 [IU] | ORAL_TABLET | Freq: Two times a day (BID) | ORAL | Status: DC
  Administered 2019-03-29 – 2019-03-30 (×3): 2000 [IU] via ORAL
  Filled 2019-03-29 (×3): qty 2

## 2019-03-29 MED ORDER — HYDROMORPHONE HCL 1 MG/ML IJ SOLN
0.2500 mg | INTRAMUSCULAR | Status: DC | PRN
  Administered 2019-03-29 (×2): 0.5 mg via INTRAVENOUS

## 2019-03-29 MED ORDER — PROPOFOL 10 MG/ML IV BOLUS
INTRAVENOUS | Status: DC | PRN
  Administered 2019-03-29: 200 mg via INTRAVENOUS

## 2019-03-29 MED ORDER — METHOCARBAMOL 1000 MG/10ML IJ SOLN
500.0000 mg | Freq: Four times a day (QID) | INTRAVENOUS | Status: DC
  Filled 2019-03-29 (×8): qty 5

## 2019-03-29 MED ORDER — VITAMIN C 500 MG PO TABS
1000.0000 mg | ORAL_TABLET | Freq: Every day | ORAL | Status: DC
  Administered 2019-03-29 – 2019-03-30 (×2): 1000 mg via ORAL
  Filled 2019-03-29 (×2): qty 2

## 2019-03-29 MED ORDER — LABETALOL HCL 5 MG/ML IV SOLN: 15.0000 mg | INTRAVENOUS | Status: DC | PRN

## 2019-03-29 MED ORDER — ACETAMINOPHEN 500 MG PO TABS
500.0000 mg | ORAL_TABLET | Freq: Two times a day (BID) | ORAL | Status: DC
  Administered 2019-03-29 – 2019-03-30 (×2): 500 mg via ORAL
  Filled 2019-03-29 (×2): qty 1

## 2019-03-29 MED ORDER — FENTANYL CITRATE (PF) 250 MCG/5ML IJ SOLN
INTRAMUSCULAR | Status: AC
  Filled 2019-03-29: qty 5

## 2019-03-29 MED ORDER — ENOXAPARIN SODIUM 40 MG/0.4ML ~~LOC~~ SOLN
40.0000 mg | SUBCUTANEOUS | Status: DC
  Administered 2019-03-30: 40 mg via SUBCUTANEOUS
  Filled 2019-03-29: qty 0.4

## 2019-03-29 MED ORDER — METOCLOPRAMIDE HCL 5 MG PO TABS: 5.0000 mg | ORAL_TABLET | Freq: Three times a day (TID) | ORAL | Status: DC | PRN

## 2019-03-29 MED ORDER — ENOXAPARIN (LOVENOX) PATIENT EDUCATION KIT: 1.0000 | PACK | Freq: Once | 0 refills | Status: AC

## 2019-03-29 MED ORDER — MIDAZOLAM HCL 5 MG/5ML IJ SOLN
INTRAMUSCULAR | Status: DC | PRN
  Administered 2019-03-29: 2 mg via INTRAVENOUS

## 2019-03-29 MED ORDER — ENOXAPARIN SODIUM 40 MG/0.4ML ~~LOC~~ SOLN: 40.0000 mg | SUBCUTANEOUS | 0 refills | Status: DC

## 2019-03-29 MED ORDER — PANTOPRAZOLE SODIUM 40 MG PO TBEC
40.0000 mg | DELAYED_RELEASE_TABLET | Freq: Every day | ORAL | Status: DC
  Administered 2019-03-29 – 2019-03-30 (×2): 40 mg via ORAL
  Filled 2019-03-29 (×2): qty 1

## 2019-03-29 MED ORDER — LIDOCAINE 2% (20 MG/ML) 5 ML SYRINGE
INTRAMUSCULAR | Status: DC | PRN
  Administered 2019-03-29: 100 mg via INTRAVENOUS

## 2019-03-29 MED ORDER — FENTANYL CITRATE (PF) 250 MCG/5ML IJ SOLN
INTRAMUSCULAR | Status: DC | PRN
  Administered 2019-03-29 (×4): 50 ug via INTRAVENOUS
  Administered 2019-03-29: 100 ug via INTRAVENOUS

## 2019-03-29 MED ORDER — OXYCODONE HCL 5 MG PO TABS
ORAL_TABLET | ORAL | Status: AC
  Filled 2019-03-29: qty 1

## 2019-03-29 MED ORDER — ACETAMINOPHEN 10 MG/ML IV SOLN
INTRAVENOUS | Status: AC
  Filled 2019-03-29: qty 100

## 2019-03-29 MED ORDER — ONDANSETRON HCL 4 MG/2ML IJ SOLN: 4.0000 mg | Freq: Four times a day (QID) | INTRAMUSCULAR | Status: DC | PRN

## 2019-03-29 MED ORDER — DOCUSATE SODIUM 100 MG PO CAPS: 100.0000 mg | ORAL_CAPSULE | Freq: Two times a day (BID) | ORAL | 0 refills | Status: DC

## 2019-03-29 MED ORDER — POTASSIUM CHLORIDE IN NACL 20-0.9 MEQ/L-% IV SOLN
INTRAVENOUS | Status: DC
  Administered 2019-03-29 (×2): via INTRAVENOUS
  Filled 2019-03-29: qty 1000

## 2019-03-29 MED ORDER — LABETALOL HCL 5 MG/ML IV SOLN
INTRAVENOUS | Status: AC
  Filled 2019-03-29: qty 4

## 2019-03-29 MED ORDER — OXYCODONE-ACETAMINOPHEN 5-325 MG PO TABS
1.0000 | ORAL_TABLET | Freq: Four times a day (QID) | ORAL | Status: DC | PRN
  Administered 2019-03-29 – 2019-03-30 (×2): 2 via ORAL
  Filled 2019-03-29 (×2): qty 2

## 2019-03-29 MED ORDER — OXYCODONE HCL 5 MG PO TABS
5.0000 mg | ORAL_TABLET | ORAL | Status: DC | PRN
  Administered 2019-03-29 – 2019-03-30 (×2): 10 mg via ORAL
  Filled 2019-03-29 (×2): qty 2

## 2019-03-29 MED ORDER — LACTATED RINGERS IV SOLN
INTRAVENOUS | Status: DC
  Administered 2019-03-29 (×2): via INTRAVENOUS

## 2019-03-29 MED ORDER — ONDANSETRON HCL 4 MG/2ML IJ SOLN
INTRAMUSCULAR | Status: DC | PRN
  Administered 2019-03-29: 4 mg via INTRAVENOUS

## 2019-03-29 MED ORDER — HYDRALAZINE HCL 20 MG/ML IJ SOLN
10.0000 mg | Freq: Once | INTRAMUSCULAR | Status: AC
  Administered 2019-03-29: 10 mg via INTRAVENOUS

## 2019-03-29 MED ORDER — METHOCARBAMOL 500 MG PO TABS: 500.0000 mg | ORAL_TABLET | Freq: Four times a day (QID) | ORAL | 0 refills | Status: DC | PRN

## 2019-03-29 MED ORDER — POLYETHYLENE GLYCOL 3350 17 G PO PACK
17.0000 g | PACK | Freq: Every day | ORAL | Status: DC
  Administered 2019-03-29: 17 g via ORAL
  Filled 2019-03-29: qty 1

## 2019-03-29 MED ORDER — HYDROMORPHONE HCL 1 MG/ML IJ SOLN
INTRAMUSCULAR | Status: AC
  Filled 2019-03-29: qty 1

## 2019-03-29 MED ORDER — 0.9 % SODIUM CHLORIDE (POUR BTL) OPTIME
TOPICAL | Status: DC | PRN
  Administered 2019-03-29: 12:00:00 1000 mL

## 2019-03-29 MED ORDER — CEFAZOLIN SODIUM-DEXTROSE 2-3 GM-%(50ML) IV SOLR
INTRAVENOUS | Status: DC | PRN
  Administered 2019-03-29: 2 g via INTRAVENOUS

## 2019-03-29 MED ORDER — FENTANYL CITRATE (PF) 100 MCG/2ML IJ SOLN
INTRAMUSCULAR | Status: AC
  Filled 2019-03-29: qty 2

## 2019-03-29 MED ORDER — OXYCODONE-ACETAMINOPHEN 5-325 MG PO TABS: 1.0000 | ORAL_TABLET | Freq: Four times a day (QID) | ORAL | 0 refills | Status: DC | PRN

## 2019-03-29 MED ORDER — CEFAZOLIN SODIUM-DEXTROSE 2-4 GM/100ML-% IV SOLN
INTRAVENOUS | Status: AC
  Filled 2019-03-29: qty 100

## 2019-03-29 MED ORDER — ROCURONIUM BROMIDE 10 MG/ML (PF) SYRINGE
PREFILLED_SYRINGE | INTRAVENOUS | Status: DC | PRN
  Administered 2019-03-29: 60 mg via INTRAVENOUS

## 2019-03-29 MED ORDER — SUGAMMADEX SODIUM 200 MG/2ML IV SOLN
INTRAVENOUS | Status: DC | PRN
  Administered 2019-03-29: 200 mg via INTRAVENOUS

## 2019-03-29 MED ORDER — DOCUSATE SODIUM 100 MG PO CAPS
100.0000 mg | ORAL_CAPSULE | Freq: Two times a day (BID) | ORAL | Status: DC
  Administered 2019-03-29 – 2019-03-30 (×3): 100 mg via ORAL
  Filled 2019-03-29 (×3): qty 1

## 2019-03-29 MED ORDER — OXYCODONE HCL 5 MG PO TABS: 5.0000 mg | ORAL_TABLET | Freq: Once | ORAL | Status: DC | PRN

## 2019-03-29 MED ORDER — ASCORBIC ACID 1000 MG PO TABS: 1000.0000 mg | ORAL_TABLET | Freq: Every day | ORAL | 1 refills | Status: DC

## 2019-03-29 MED ORDER — OXYCODONE HCL 5 MG/5ML PO SOLN: 5.0000 mg | Freq: Once | ORAL | Status: DC | PRN

## 2019-03-29 MED ORDER — METOCLOPRAMIDE HCL 5 MG/ML IJ SOLN: 5.0000 mg | Freq: Three times a day (TID) | INTRAMUSCULAR | Status: DC | PRN

## 2019-03-29 MED ORDER — ONDANSETRON HCL 4 MG PO TABS: 4.0000 mg | ORAL_TABLET | Freq: Four times a day (QID) | ORAL | Status: DC | PRN

## 2019-03-29 MED ORDER — HYDRALAZINE HCL 20 MG/ML IJ SOLN
INTRAMUSCULAR | Status: AC
  Filled 2019-03-29: qty 1

## 2019-03-29 MED ORDER — PROMETHAZINE HCL 25 MG/ML IJ SOLN: 6.2500 mg | INTRAMUSCULAR | Status: DC | PRN

## 2019-03-29 MED ORDER — ENOXAPARIN (LOVENOX) PATIENT EDUCATION KIT
PACK | Freq: Once | Status: AC
  Administered 2019-03-29: 17:00:00
  Filled 2019-03-29: qty 1

## 2019-03-29 MED ORDER — ACETAMINOPHEN 325 MG PO TABS
325.0000 mg | ORAL_TABLET | Freq: Four times a day (QID) | ORAL | Status: DC | PRN
  Administered 2019-03-30: 12:00:00 650 mg via ORAL
  Filled 2019-03-29: qty 2

## 2019-03-29 MED ORDER — DEXAMETHASONE SODIUM PHOSPHATE 10 MG/ML IJ SOLN
INTRAMUSCULAR | Status: DC | PRN
  Administered 2019-03-29: 10 mg via INTRAVENOUS

## 2019-03-29 MED ORDER — METHOCARBAMOL 750 MG PO TABS
750.0000 mg | ORAL_TABLET | Freq: Four times a day (QID) | ORAL | Status: DC
  Administered 2019-03-29 – 2019-03-30 (×3): 750 mg via ORAL
  Filled 2019-03-29 (×9): qty 1

## 2019-03-29 MED FILL — OXYCODONE W/APAP 5/325 TAB: 5-325 | 7 days supply | Qty: 50 | Fill #0

## 2019-03-29 MED FILL — METHOCARBAMOL 500 MG TABLET: 500 | 8 days supply | Qty: 60 | Fill #0

## 2019-03-29 MED FILL — DOK 100 MG CAPS: 100 | 15 days supply | Qty: 30 | Fill #0

## 2019-03-29 MED FILL — VITAMIN D3 1,000 UNIT TAB: 25 MCG | 30 days supply | Qty: 60 | Fill #0

## 2019-03-29 MED FILL — ENOXAPARIN SODIUM 40 MG/0.4: 40 | 21 days supply | Qty: 8 | Fill #0

## 2019-03-29 MED FILL — VITAMIN C 500 MG TABLET: 500 | 30 days supply | Qty: 60 | Fill #0

## 2019-03-29 SURGICAL SUPPLY — 73 items
BANDAGE ESMARK 6X9 LF (GAUZE/BANDAGES/DRESSINGS) ×1 IMPLANT
BAR EXFX 150X11 NS LF (EXFIX) ×2
BAR EXFX 400X11 NS LF (EXFIX) ×2
BAR GLASS FIBER EXFX 11X150 (EXFIX) ×4 IMPLANT
BAR GLASS FIBER EXFX 11X400 (EXFIX) ×4 IMPLANT
BIT DRILL 3.5 (BIT) ×1
BIT DRILL 3.5MM (BIT) IMPLANT
BNDG CMPR 9X6 STRL LF SNTH (GAUZE/BANDAGES/DRESSINGS)
BNDG ELASTIC 2X5.8 VLCR STR LF (GAUZE/BANDAGES/DRESSINGS) ×2 IMPLANT
BNDG ELASTIC 3X5.8 VLCR STR LF (GAUZE/BANDAGES/DRESSINGS) ×2 IMPLANT
BNDG ELASTIC 4X5.8 VLCR STR LF (GAUZE/BANDAGES/DRESSINGS) IMPLANT
BNDG ELASTIC 6X5.8 VLCR STR LF (GAUZE/BANDAGES/DRESSINGS) IMPLANT
BNDG ESMARK 6X9 LF (GAUZE/BANDAGES/DRESSINGS)
BNDG GAUZE ELAST 4 BULKY (GAUZE/BANDAGES/DRESSINGS) ×6 IMPLANT
BRUSH SCRUB EZ PLAIN DRY (MISCELLANEOUS) ×6 IMPLANT
CLAMP BLUE BAR TO BAR (EXFIX) ×4 IMPLANT
CLAMP BLUE BAR TO PIN (EXFIX) ×8 IMPLANT
COVER MAYO STAND STRL (DRAPES) ×1 IMPLANT
COVER SURGICAL LIGHT HANDLE (MISCELLANEOUS) ×3 IMPLANT
COVER WAND RF STERILE (DRAPES) ×1 IMPLANT
DRAPE C-ARM 42X72 X-RAY (DRAPES) ×3 IMPLANT
DRAPE C-ARMOR (DRAPES) ×3 IMPLANT
DRAPE HALF SHEET 40X57 (DRAPES) ×3 IMPLANT
DRAPE U-SHAPE 47X51 STRL (DRAPES) ×3 IMPLANT
DRILL BIT 3.5MM (BIT) ×3
DRSG EMULSION OIL 3X3 NADH (GAUZE/BANDAGES/DRESSINGS) IMPLANT
DRSG MEPITEL 4X7.2 (GAUZE/BANDAGES/DRESSINGS) ×2 IMPLANT
ELECT REM PT RETURN 9FT ADLT (ELECTROSURGICAL) ×3
ELECTRODE REM PT RTRN 9FT ADLT (ELECTROSURGICAL) ×1 IMPLANT
GAUZE SPONGE 4X4 12PLY STRL (GAUZE/BANDAGES/DRESSINGS) ×6 IMPLANT
GLOVE BIO SURGEON STRL SZ7.5 (GLOVE) ×3 IMPLANT
GLOVE BIO SURGEON STRL SZ8 (GLOVE) ×3 IMPLANT
GLOVE BIOGEL PI IND STRL 7.5 (GLOVE) ×1 IMPLANT
GLOVE BIOGEL PI IND STRL 8 (GLOVE) ×1 IMPLANT
GLOVE BIOGEL PI INDICATOR 7.5 (GLOVE) ×2
GLOVE BIOGEL PI INDICATOR 8 (GLOVE) ×2
GOWN STRL REUS W/ TWL LRG LVL3 (GOWN DISPOSABLE) ×2 IMPLANT
GOWN STRL REUS W/ TWL XL LVL3 (GOWN DISPOSABLE) ×1 IMPLANT
GOWN STRL REUS W/TWL LRG LVL3 (GOWN DISPOSABLE) ×6
GOWN STRL REUS W/TWL XL LVL3 (GOWN DISPOSABLE) ×3
HALF PIN 3MM (EXFIX) ×4 IMPLANT
HALF PIN 5.0X160 (EXFIX) ×4 IMPLANT
KIT BASIN OR (CUSTOM PROCEDURE TRAY) ×3 IMPLANT
KIT TURNOVER KIT B (KITS) ×3 IMPLANT
MANIFOLD NEPTUNE II (INSTRUMENTS) ×1 IMPLANT
NAIL FLEX WIN 3.0MM (Nail) ×2 IMPLANT
NDL HYPO 21X1.5 SAFETY (NEEDLE) IMPLANT
NEEDLE HYPO 21X1.5 SAFETY (NEEDLE) IMPLANT
NS IRRIG 1000ML POUR BTL (IV SOLUTION) ×3 IMPLANT
PACK GENERAL/GYN (CUSTOM PROCEDURE TRAY) ×3 IMPLANT
PACK ORTHO EXTREMITY (CUSTOM PROCEDURE TRAY) ×3 IMPLANT
PAD ARMBOARD 7.5X6 YLW CONV (MISCELLANEOUS) ×6 IMPLANT
PAD CAST 3X4 CTTN HI CHSV (CAST SUPPLIES) IMPLANT
PAD CAST 4YDX4 CTTN HI CHSV (CAST SUPPLIES) IMPLANT
PADDING CAST COTTON 3X4 STRL (CAST SUPPLIES) ×3
PADDING CAST COTTON 4X4 STRL (CAST SUPPLIES)
PADDING CAST COTTON 6X4 STRL (CAST SUPPLIES) IMPLANT
PIN CLAMP 2BAR 75MM BLUE (EXFIX) ×2 IMPLANT
PIN TRANSFIXING 5.0 (EXFIX) ×2 IMPLANT
SPONGE LAP 18X18 RF (DISPOSABLE) ×3 IMPLANT
STAPLER VISISTAT 35W (STAPLE) IMPLANT
SUCTION FRAZIER HANDLE 10FR (MISCELLANEOUS) ×2
SUCTION TUBE FRAZIER 10FR DISP (MISCELLANEOUS) ×1 IMPLANT
SUT ETHILON 3 0 PS 1 (SUTURE) ×6 IMPLANT
SUT PDS AB 2-0 CT1 27 (SUTURE) IMPLANT
SUT VIC AB 2-0 CT1 27 (SUTURE)
SUT VIC AB 2-0 CT1 TAPERPNT 27 (SUTURE) ×2 IMPLANT
TOWEL GREEN STERILE (TOWEL DISPOSABLE) ×8 IMPLANT
TOWEL GREEN STERILE FF (TOWEL DISPOSABLE) ×3 IMPLANT
TUBE CONNECTING 12'X1/4 (SUCTIONS) ×1
TUBE CONNECTING 12X1/4 (SUCTIONS) ×2 IMPLANT
UNDERPAD 30X30 (UNDERPADS AND DIAPERS) ×5 IMPLANT
WATER STERILE IRR 1000ML POUR (IV SOLUTION) ×3 IMPLANT

## 2019-03-29 NOTE — Transfer of Care (Signed)
Immediate Anesthesia Transfer of Care Note  Patient: Joel Garner  Procedure(s) Performed: Applicaiton of External Fixator Ankle, INSERTION OF FLEXIBLE NAILFIBULA (Right Ankle)  Patient Location: PACU  Anesthesia Type:General  Level of Consciousness: awake, alert  and oriented  Airway & Oxygen Therapy: Patient Spontanous Breathing and Patient connected to face mask oxygen  Post-op Assessment: Report given to RN, Post -op Vital signs reviewed and stable and Patient moving all extremities X 4  Post vital signs: Reviewed and stable  Last Vitals:  Vitals Value Taken Time  BP 168/109 03/29/19 1259  Temp    Pulse 90 03/29/19 1301  Resp 24 03/29/19 1301  SpO2 100 % 03/29/19 1301  Vitals shown include unvalidated device data.  Last Pain:  Vitals:   03/29/19 0926  TempSrc:   PainSc: 3       Patients Stated Pain Goal: 2 (38/88/75 7972)  Complications: No apparent anesthesia complications

## 2019-03-29 NOTE — Discharge Instructions (Signed)
[facility] Discharge Instructions  General Discharge Instructions  WEIGHT BEARING STATUS:  Nonweightbearing Right Leg   RANGE OF MOTION/ACTIVITY: activity as tolerated while maintaining weightbearing restrictions.  Ok to move toes and knee on right leg   Wound Care: daily wound care starting upon discharge from the hospital. See below    Discharge Pin Site Instructions  Dress pins daily with Kerlix roll starting on POD 2. Wrap the Kerlix so that it tamps the skin down around the pin-skin interface to prevent/limit motion of the skin relative to the pin.  (Pin-skin motion is the primary cause of pain and infection related to external fixator pin sites).  Remove any crust or coagulum that may obstruct drainage with a saline moistened gauze or soap and water.  After POD 3, if there is no discernable drainage on the pin site dressing, the interval for change can by increased to every other day.  You may shower with the fixator, cleaning all pin sites gently with soap and water.  If you have a surgical wound this needs to be completely dry and without drainage before showering.  The extremity can be lifted by the fixator to facilitate wound care and transfers.  Notify the office/Doctor if you experience increasing drainage, redness, or pain from a pin site, or if you notice purulent (thick, snot-like) drainage.  Discharge Wound Care Instructions  Do NOT apply any ointments, solutions or lotions to pin sites or surgical wounds.  These prevent needed drainage and even though solutions like hydrogen peroxide kill bacteria, they also damage cells lining the pin sites that help fight infection.  Applying lotions or ointments can keep the wounds moist and can cause them to breakdown and open up as well. This can increase the risk for infection. When in doubt call the office.  Surgical incisions should be dressed daily.  If any drainage is noted, use one layer of adaptic, then  gauze, Kerlix, and an ace wrap.  Once the incision is completely dry and without drainage, it may be left open to air out.  Showering may begin 36-48 hours later.  Cleaning gently with soap and water.  Traumatic wounds should be dressed daily as well.    One layer of adaptic, gauze, Kerlix, then ace wrap.  The adaptic can be discontinued once the draining has ceased    If you have a wet to dry dressing: wet the gauze with saline the squeeze as much saline out so the gauze is moist (not soaking wet), place moistened gauze over wound, then place a dry gauze over the moist one, followed by Kerlix wrap, then ace wrap.  DVT/PE prophylaxis: Lovenox injection daily x 21 days   Diet: as you were eating previously.  Can use over the counter stool softeners and bowel preparations, such as Miralax, to help with bowel movements.  Narcotics can be constipating.  Be sure to drink plenty of fluids  PAIN MEDICATION USE AND EXPECTATIONS  You have likely been given narcotic medications to help control your pain.  After a traumatic event that results in an fracture (broken bone) with or without surgery, it is ok to use narcotic pain medications to help control one's pain.  We understand that everyone responds to pain differently and each individual patient will be evaluated on a regular basis for the continued need for narcotic medications. Ideally, narcotic medication use should last no more than 6-8 weeks (coinciding with fracture healing).   As a patient it is your  responsibility as well to monitor narcotic medication use and report the amount and frequency you use these medications when you come to your office visit.   We would also advise that if you are using narcotic medications, you should take a dose prior to therapy to maximize you participation.  IF YOU ARE ON NARCOTIC MEDICATIONS IT IS NOT PERMISSIBLE TO OPERATE A MOTOR VEHICLE (MOTORCYCLE/CAR/TRUCK/MOPED) OR HEAVY MACHINERY DO NOT MIX NARCOTICS WITH  OTHER CNS (CENTRAL NERVOUS SYSTEM) DEPRESSANTS SUCH AS ALCOHOL   STOP SMOKING OR USING NICOTINE PRODUCTS!!!!  As discussed nicotine severely impairs your body's ability to heal surgical and traumatic wounds but also impairs bone healing.  Wounds and bone heal by forming microscopic blood vessels (angiogenesis) and nicotine is a vasoconstrictor (essentially, shrinks blood vessels).  Therefore, if vasoconstriction occurs to these microscopic blood vessels they essentially disappear and are unable to deliver necessary nutrients to the healing tissue.  This is one modifiable factor that you can do to dramatically increase your chances of healing your injury.    (This means no smoking, no nicotine gum, patches, etc)  DO NOT USE NONSTEROIDAL ANTI-INFLAMMATORY DRUGS (NSAID'S)  Using products such as Advil (ibuprofen), Aleve (naproxen), Motrin (ibuprofen) for additional pain control during fracture healing can delay and/or prevent the healing response.  If you would like to take over the counter (OTC) medication, Tylenol (acetaminophen) is ok.  However, some narcotic medications that are given for pain control contain acetaminophen as well. Therefore, you should not exceed more than 4000 mg of tylenol in a day if you do not have liver disease.  Also note that there are may OTC medicines, such as cold medicines and allergy medicines that my contain tylenol as well.  If you have any questions about medications and/or interactions please ask your doctor/PA or your pharmacist.      ICE AND ELEVATE INJURED/OPERATIVE EXTREMITY  Using ice and elevating the injured extremity above your heart can help with swelling and pain control.  Icing in a pulsatile fashion, such as 20 minutes on and 20 minutes off, can be followed.    Do not place ice directly on skin. Make sure there is a barrier between to skin and the ice pack.    Using frozen items such as frozen peas works well as the conform nicely to the are that needs to  be iced.  USE AN ACE WRAP OR TED HOSE FOR SWELLING CONTROL  In addition to icing and elevation, Ace wraps or TED hose are used to help limit and resolve swelling.  It is recommended to use Ace wraps or TED hose until you are informed to stop.    When using Ace Wraps start the wrapping distally (farthest away from the body) and wrap proximally (closer to the body)   Example: If you had surgery on your leg or thing and you do not have a splint on, start the ace wrap at the toes and work your way up to the thigh        If you had surgery on your upper extremity and do not have a splint on, start the ace wrap at your fingers and work your way up to the upper arm  IF YOU ARE IN A SPLINT OR CAST DO NOT [place]   If your splint gets wet for any reason please contact the office immediately. You may shower in your splint or cast as long as you keep it dry.  This can be  done by wrapping in a cast cover or garbage back (or similar)  Do Not stick any thing down your splint or cast such as pencils, money, or hangers to try and scratch yourself with.  If you feel itchy take benadryl as prescribed on the bottle for itching  IF YOU ARE IN A CAM BOOT (BLACK BOOT)  You may remove boot periodically. Perform daily dressing changes as noted below.  Wash the liner of the boot regularly and wear a sock when wearing the boot. It is recommended that you sleep in the boot until told otherwise    CALL THE OFFICE WITH ANY QUESTIONS OR CONCERNS: [phone]   VISIT OUR WEBSITE FOR ADDITIONAL INFORMATION: [email or web address]

## 2019-03-29 NOTE — Anesthesia Preprocedure Evaluation (Addendum)
Anesthesia Evaluation  Patient identified by MRN, date of birth, ID band Patient awake    Reviewed: Allergy & Precautions, NPO status , Patient's Chart, lab work & pertinent test results  History of Anesthesia Complications Negative for: history of anesthetic complications  Airway Mallampati: II  TM Distance: >3 FB Neck ROM: Full    Dental  (+) Teeth Intact   Pulmonary former smoker,    Pulmonary exam normal        Cardiovascular negative cardio ROS Normal cardiovascular exam     Neuro/Psych negative neurological ROS     GI/Hepatic negative GI ROS, Neg liver ROS,   Endo/Other  negative endocrine ROS  Renal/GU negative Renal ROS     Musculoskeletal negative musculoskeletal ROS (+)   Abdominal   Peds  Hematology negative hematology ROS (+)   Anesthesia Other Findings Right ankle fracture  Reproductive/Obstetrics                            Anesthesia Physical Anesthesia Plan  ASA: I  Anesthesia Plan: General   Post-op Pain Management:    Induction: Intravenous  PONV Risk Score and Plan: 3 and Treatment may vary due to age or medical condition, Ondansetron, Dexamethasone and Midazolam  Airway Management Planned: Oral ETT  Additional Equipment: None  Intra-op Plan:   Post-operative Plan: Extubation in OR  Informed Consent: I have reviewed the patients History and Physical, chart, labs and discussed the procedure including the risks, benefits and alternatives for the proposed anesthesia with the patient or authorized representative who has indicated his/her understanding and acceptance.     Dental advisory given  Plan Discussed with:   Anesthesia Plan Comments:        Anesthesia Quick Evaluation

## 2019-03-29 NOTE — Brief Op Note (Signed)
03/29/2019  7:22 PM  PATIENT:  Joel Garner  33 y.o. male  PRE-OPERATIVE DIAGNOSIS:  RIGHT PILON FRACTURE, TIBIA AND FIBULA  POST-OPERATIVE DIAGNOSIS:  RIGHT PILON FRACTURE, TIBIA AND FIBULA  PROCEDURE:  Procedure(s): 1. ORIF OF RIGHT TIBIAL PILON FRACTURE, FIBULA ONLY 2. APPLICATION OF EXTERNAL FIXATOR RIGHT ANKLE AND FOOT 3. CLOSED REDUCTION OF TIBIAL PILON FRACTURE, TIBIA  SURGEON:  Surgeon(s) and Role:    Altamese Walker, MD - Primary  PHYSICIAN ASSISTANT: Ainsley Spinner, PA-C  ANESTHESIA:   general  EBL:  10 mL   BLOOD ADMINISTERED:none  DRAINS: none   LOCAL MEDICATIONS USED:  NONE  SPECIMEN:  No Specimen  DISPOSITION OF SPECIMEN:  N/A  COUNTS:  YES  TOURNIQUET:  * No tourniquets in log *  DICTATION: [id number]  PLAN OF CARE: Admit to inpatient   PATIENT DISPOSITION:  PACU - hemodynamically stable.   Delay start of Pharmacological VTE agent (>24hrs) due to surgical blood loss or risk of bleeding: no

## 2019-03-29 NOTE — Anesthesia Postprocedure Evaluation (Signed)
Anesthesia Post Note  Patient: Joel Garner  Procedure(s) Performed: Applicaiton of External Fixator Ankle, INSERTION OF FLEXIBLE NAILFIBULA (Right Ankle)     Patient location during evaluation: PACU Anesthesia Type: General Level of consciousness: awake and alert Pain management: pain level controlled Vital Signs Assessment: post-procedure vital signs reviewed and stable Respiratory status: spontaneous breathing, nonlabored ventilation and respiratory function stable Cardiovascular status: blood pressure returned to baseline and stable Postop Assessment: no apparent nausea or vomiting Anesthetic complications: no    Last Vitals:  Vitals:   03/29/19 1527 03/29/19 1530  BP: (!) 151/105 (!) 129/97  Pulse: 90 90  Resp:    Temp: 37.3 C   SpO2: 97%     Last Pain:  Vitals:   03/29/19 1527  TempSrc: Oral  PainSc:                  Lidia Collum
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
## 2019-03-29 NOTE — Anesthesia Procedure Notes (Signed)
Procedure Name: Intubation Date/Time: 03/29/2019 11:19 AM Performed by: Gaylene Brooks, CRNA Pre-anesthesia Checklist: Patient identified, Emergency Drugs available, Suction available and Patient being monitored Patient Re-evaluated:Patient Re-evaluated prior to induction Oxygen Delivery Method: Circle System Utilized Preoxygenation: Pre-oxygenation with 100% oxygen Induction Type: IV induction Ventilation: Mask ventilation without difficulty Laryngoscope Size: Miller and 2 Grade View: Grade I Tube type: Oral Tube size: 7.5 mm Number of attempts: 1 Airway Equipment and Method: Stylet and Oral airway Placement Confirmation: ETT inserted through vocal cords under direct vision,  positive ETCO2 and breath sounds checked- equal and bilateral Secured at: 23 cm Tube secured with: Tape Dental Injury: Teeth and Oropharynx as per pre-operative assessment
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
## 2019-03-29 NOTE — Plan of Care (Signed)
  Problem: Pain Managment: Goal: General experience of comfort will improve Outcome: Progressing   Problem: Coping: Goal: Level of anxiety will decrease Outcome: Progressing   Problem: Coping: Goal: Level of anxiety will decrease Outcome: Progressing   Problem: Clinical Measurements: Goal: Diagnostic test results will improve Outcome: Progressing
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
## 2019-03-30 NOTE — Progress Notes (Signed)
Confirmed with mother that she had the Lovenox Kit at home. Mother states, "yes". Discharge paperwork gone over in detail with patient and father. Explained all new medications and wound care to patient and father as well as mother. All questions answered to patient and family satisfaction. IV removed, patient discharged to home with father by way of wheelchair. VSS.   Lucius Conn, RN
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
## 2019-04-01 ENCOUNTER — Encounter (HOSPITAL_COMMUNITY): Payer: Self-pay | Admitting: Orthopedic Surgery
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
## 2019-04-03 ENCOUNTER — Encounter (HOSPITAL_COMMUNITY): Payer: Self-pay | Admitting: Orthopedic Surgery

## 2019-04-29 ENCOUNTER — Other Ambulatory Visit: Payer: Self-pay

## 2019-04-29 ENCOUNTER — Encounter (HOSPITAL_COMMUNITY): Payer: Self-pay | Admitting: Certified Registered Nurse Anesthetist

## 2019-04-29 ENCOUNTER — Other Ambulatory Visit
Admission: RE | Admit: 2019-04-29 | Discharge: 2019-04-29 | Disposition: A | Discharge status: Home / Self Care | Payer: Self-pay | Source: Ambulatory Visit | Attending: Orthopedic Surgery | Admitting: Orthopedic Surgery

## 2019-04-29 DIAGNOSIS — Z01812 Encounter for preprocedural laboratory examination: Secondary | ICD-10-CM | POA: Insufficient documentation

## 2019-04-29 DIAGNOSIS — Z20828 Contact with and (suspected) exposure to other viral communicable diseases: Secondary | ICD-10-CM | POA: Insufficient documentation
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
## 2019-04-30 ENCOUNTER — Inpatient Hospital Stay (HOSPITAL_COMMUNITY)
Admission: RE | Admit: 2019-04-30 | Discharge: 2019-05-01 | DRG: 493 | Disposition: A | Discharge status: Home / Self Care | Payer: Self-pay | Attending: Orthopedic Surgery | Admitting: Orthopedic Surgery

## 2019-04-30 ENCOUNTER — Encounter (HOSPITAL_COMMUNITY)
Admission: RE | Disposition: A | Discharge status: Home / Self Care | Payer: Self-pay | Source: Home / Self Care | Attending: Orthopedic Surgery

## 2019-04-30 ENCOUNTER — Inpatient Hospital Stay (HOSPITAL_COMMUNITY): Payer: Self-pay | Admitting: Certified Registered Nurse Anesthetist

## 2019-04-30 ENCOUNTER — Inpatient Hospital Stay (HOSPITAL_COMMUNITY): Payer: Self-pay

## 2019-04-30 ENCOUNTER — Encounter (HOSPITAL_COMMUNITY): Payer: Self-pay

## 2019-04-30 ENCOUNTER — Other Ambulatory Visit: Payer: Self-pay

## 2019-04-30 DIAGNOSIS — Z01818 Encounter for other preprocedural examination: Secondary | ICD-10-CM

## 2019-04-30 DIAGNOSIS — S82839P Other fracture of upper and lower end of unspecified fibula, subsequent encounter for closed fracture with malunion: Secondary | ICD-10-CM

## 2019-04-30 DIAGNOSIS — Z87891 Personal history of nicotine dependence: Secondary | ICD-10-CM

## 2019-04-30 DIAGNOSIS — S82871K Displaced pilon fracture of right tibia, subsequent encounter for closed fracture with nonunion: Secondary | ICD-10-CM | POA: Diagnosis not present

## 2019-04-30 DIAGNOSIS — T148XXA Other injury of unspecified body region, initial encounter: Secondary | ICD-10-CM

## 2019-04-30 DIAGNOSIS — D62 Acute posthemorrhagic anemia: Secondary | ICD-10-CM | POA: Diagnosis present

## 2019-04-30 DIAGNOSIS — S82871A Displaced pilon fracture of right tibia, initial encounter for closed fracture: Secondary | ICD-10-CM | POA: Diagnosis present

## 2019-04-30 DIAGNOSIS — S82851A Displaced trimalleolar fracture of right lower leg, initial encounter for closed fracture: Secondary | ICD-10-CM | POA: Diagnosis present

## 2019-04-30 DIAGNOSIS — S82401K Unspecified fracture of shaft of right fibula, subsequent encounter for closed fracture with nonunion: Secondary | ICD-10-CM

## 2019-04-30 DIAGNOSIS — S82309P Unspecified fracture of lower end of unspecified tibia, subsequent encounter for closed fracture with malunion: Secondary | ICD-10-CM

## 2019-04-30 HISTORY — PX: EXTERNAL FIXATION REMOVAL: SHX5040

## 2019-04-30 HISTORY — PX: ORIF TIBIA FRACTURE: SHX5416

## 2019-04-30 HISTORY — PX: APPLICATION OF WOUND VAC: SHX5189

## 2019-04-30 LAB — COMPREHENSIVE METABOLIC PANEL
ALT: 22 U/L (ref 0–44)
AST: 24 U/L (ref 15–41)
Albumin: 3.6 g/dL (ref 3.5–5.0)
Alkaline Phosphatase: 58 U/L (ref 38–126)
Anion gap: 12 (ref 5–15)
BUN: 10 mg/dL (ref 6–20)
CO2: 24 mmol/L (ref 22–32)
Calcium: 9.2 mg/dL (ref 8.9–10.3)
Chloride: 103 mmol/L (ref 98–111)
Creatinine, Ser: 1.25 mg/dL — ABNORMAL HIGH (ref 0.61–1.24)
GFR calc Af Amer: >60 mL/min (ref >60)
GFR calc non Af Amer: >60 mL/min (ref >60)
Glucose, Bld: 145 mg/dL — ABNORMAL HIGH (ref 70–99)
Potassium: 4.2 mmol/L (ref 3.5–5.1)
Sodium: 139 mmol/L (ref 135–145)
Total Bilirubin: 0.7 mg/dL (ref 0.3–1.2)
Total Protein: 6.7 g/dL (ref 6.5–8.1)

## 2019-04-30 LAB — CBC WITH DIFFERENTIAL/PLATELET
Abs Immature Granulocytes: 0.01 10*3/uL (ref 0.00–0.07)
Basophils Absolute: 0 10*3/uL (ref 0.0–0.1)
Basophils Relative: 0 %
Eosinophils Absolute: 0 10*3/uL (ref 0.0–0.5)
Eosinophils Relative: 0 %
HCT: 40.9 % (ref 39.0–52.0)
Hemoglobin: 12.9 g/dL — ABNORMAL LOW (ref 13.0–17.0)
Immature Granulocytes: 0 %
Lymphocytes Relative: 17 %
Lymphs Abs: 0.8 10*3/uL (ref 0.7–4.0)
MCH: 27 pg (ref 26.0–34.0)
MCHC: 31.5 g/dL (ref 30.0–36.0)
MCV: 85.7 fL (ref 80.0–100.0)
Monocytes Absolute: 0.1 10*3/uL (ref 0.1–1.0)
Monocytes Relative: 1 %
Neutro Abs: 4 10*3/uL (ref 1.7–7.7)
Neutrophils Relative %: 82 %
Platelets: 243 10*3/uL (ref 150–400)
RBC: 4.77 MIL/uL (ref 4.22–5.81)
RDW: 12.2 % (ref 11.5–15.5)
WBC: 4.9 10*3/uL (ref 4.0–10.5)
nRBC: 0 % (ref 0.0–0.2)

## 2019-04-30 LAB — PROTIME-INR
INR: 1.1 (ref 0.8–1.2)
Prothrombin Time: 14.2 seconds (ref 11.4–15.2)

## 2019-04-30 LAB — SARS CORONAVIRUS 2 (TAT 6-24 HRS): SARS Coronavirus 2: NEGATIVE

## 2019-04-30 LAB — APTT: aPTT: 30 seconds (ref 24–36)

## 2019-04-30 SURGERY — OPEN REDUCTION INTERNAL FIXATION (ORIF) TIBIA FRACTURE
Anesthesia: General | Site: Ankle | Laterality: Right

## 2019-04-30 MED ORDER — ONDANSETRON HCL 4 MG/2ML IJ SOLN: 4.0000 mg | Freq: Once | INTRAMUSCULAR | Status: DC | PRN

## 2019-04-30 MED ORDER — CHLORHEXIDINE GLUCONATE 4 % EX LIQD: 60.0000 mL | Freq: Once | CUTANEOUS | Status: DC

## 2019-04-30 MED ORDER — FENTANYL CITRATE (PF) 100 MCG/2ML IJ SOLN
INTRAMUSCULAR | Status: AC
  Filled 2019-04-30: qty 2

## 2019-04-30 MED ORDER — ROCURONIUM BROMIDE 10 MG/ML (PF) SYRINGE
PREFILLED_SYRINGE | INTRAVENOUS | Status: AC
  Filled 2019-04-30: qty 10

## 2019-04-30 MED ORDER — OXYCODONE HCL 5 MG PO TABS: 5.0000 mg | ORAL_TABLET | Freq: Once | ORAL | Status: DC | PRN

## 2019-04-30 MED ORDER — METHOCARBAMOL 500 MG PO TABS
750.0000 mg | ORAL_TABLET | Freq: Three times a day (TID) | ORAL | Status: DC
  Administered 2019-04-30 – 2019-05-01 (×4): 750 mg via ORAL
  Filled 2019-04-30 (×4): qty 2

## 2019-04-30 MED ORDER — OXYCODONE-ACETAMINOPHEN 7.5-325 MG PO TABS
1.0000 | ORAL_TABLET | Freq: Four times a day (QID) | ORAL | Status: DC | PRN
  Administered 2019-04-30 – 2019-05-01 (×3): 2 via ORAL
  Filled 2019-04-30: qty 1
  Filled 2019-04-30: qty 2
  Filled 2019-04-30: qty 1
  Filled 2019-04-30: qty 2

## 2019-04-30 MED ORDER — OXYCODONE HCL 5 MG/5ML PO SOLN: 5.0000 mg | Freq: Once | ORAL | Status: DC | PRN

## 2019-04-30 MED ORDER — ONDANSETRON HCL 4 MG/2ML IJ SOLN: 4.0000 mg | Freq: Four times a day (QID) | INTRAMUSCULAR | Status: DC | PRN

## 2019-04-30 MED ORDER — LIDOCAINE 2% (20 MG/ML) 5 ML SYRINGE
INTRAMUSCULAR | Status: DC | PRN
  Administered 2019-04-30: 40 mg via INTRAVENOUS

## 2019-04-30 MED ORDER — CEFAZOLIN SODIUM-DEXTROSE 1-4 GM/50ML-% IV SOLN
1.0000 g | Freq: Four times a day (QID) | INTRAVENOUS | Status: AC
  Administered 2019-04-30 – 2019-05-01 (×3): 1 g via INTRAVENOUS
  Filled 2019-04-30 (×3): qty 50

## 2019-04-30 MED ORDER — ACETAMINOPHEN 500 MG PO TABS
1000.0000 mg | ORAL_TABLET | Freq: Once | ORAL | Status: AC
  Administered 2019-04-30: 08:00:00 1000 mg via ORAL
  Filled 2019-04-30: qty 2

## 2019-04-30 MED ORDER — MIDAZOLAM HCL 2 MG/2ML IJ SOLN
INTRAMUSCULAR | Status: AC
  Filled 2019-04-30: qty 2

## 2019-04-30 MED ORDER — METHOCARBAMOL 1000 MG/10ML IJ SOLN
500.0000 mg | Freq: Three times a day (TID) | INTRAVENOUS | Status: DC
  Filled 2019-04-30 (×6): qty 5

## 2019-04-30 MED ORDER — POTASSIUM CHLORIDE IN NACL 20-0.9 MEQ/L-% IV SOLN
INTRAVENOUS | Status: DC
  Administered 2019-04-30 – 2019-05-01 (×3): via INTRAVENOUS
  Filled 2019-04-30 (×2): qty 1000

## 2019-04-30 MED ORDER — CEFAZOLIN SODIUM-DEXTROSE 2-4 GM/100ML-% IV SOLN
2.0000 g | INTRAVENOUS | Status: AC
  Administered 2019-04-30 (×2): 2 g via INTRAVENOUS
  Filled 2019-04-30: qty 100

## 2019-04-30 MED ORDER — ENOXAPARIN SODIUM 40 MG/0.4ML ~~LOC~~ SOLN
40.0000 mg | SUBCUTANEOUS | Status: DC
  Administered 2019-05-01: 40 mg via SUBCUTANEOUS
  Filled 2019-04-30: qty 0.4

## 2019-04-30 MED ORDER — PROPOFOL 10 MG/ML IV BOLUS
INTRAVENOUS | Status: AC
  Filled 2019-04-30: qty 40

## 2019-04-30 MED ORDER — ONDANSETRON HCL 4 MG/2ML IJ SOLN
INTRAMUSCULAR | Status: DC | PRN
  Administered 2019-04-30: 4 mg via INTRAVENOUS

## 2019-04-30 MED ORDER — ACETAMINOPHEN 500 MG PO TABS
500.0000 mg | ORAL_TABLET | Freq: Two times a day (BID) | ORAL | Status: DC
  Administered 2019-04-30 – 2019-05-01 (×2): 500 mg via ORAL
  Filled 2019-04-30 (×2): qty 1

## 2019-04-30 MED ORDER — FENTANYL CITRATE (PF) 250 MCG/5ML IJ SOLN
INTRAMUSCULAR | Status: AC
  Filled 2019-04-30: qty 5

## 2019-04-30 MED ORDER — FENTANYL CITRATE (PF) 100 MCG/2ML IJ SOLN
INTRAMUSCULAR | Status: DC | PRN
  Administered 2019-04-30 (×5): 50 ug via INTRAVENOUS

## 2019-04-30 MED ORDER — 0.9 % SODIUM CHLORIDE (POUR BTL) OPTIME
TOPICAL | Status: DC | PRN
  Administered 2019-04-30: 1000 mL

## 2019-04-30 MED ORDER — MIDAZOLAM HCL 5 MG/5ML IJ SOLN
INTRAMUSCULAR | Status: DC | PRN
  Administered 2019-04-30: 2 mg via INTRAVENOUS

## 2019-04-30 MED ORDER — DOCUSATE SODIUM 100 MG PO CAPS
100.0000 mg | ORAL_CAPSULE | Freq: Two times a day (BID) | ORAL | Status: DC
  Administered 2019-04-30 – 2019-05-01 (×2): 100 mg via ORAL
  Filled 2019-04-30 (×2): qty 1

## 2019-04-30 MED ORDER — DEXAMETHASONE SODIUM PHOSPHATE 10 MG/ML IJ SOLN
INTRAMUSCULAR | Status: AC
  Filled 2019-04-30: qty 1

## 2019-04-30 MED ORDER — HYDROMORPHONE HCL 1 MG/ML IJ SOLN: 0.5000 mg | INTRAMUSCULAR | Status: DC | PRN

## 2019-04-30 MED ORDER — FENTANYL CITRATE (PF) 100 MCG/2ML IJ SOLN: 25.0000 ug | INTRAMUSCULAR | Status: DC | PRN

## 2019-04-30 MED ORDER — GABAPENTIN 300 MG PO CAPS
300.0000 mg | ORAL_CAPSULE | Freq: Once | ORAL | Status: AC
  Administered 2019-04-30: 08:00:00 300 mg via ORAL
  Filled 2019-04-30: qty 1

## 2019-04-30 MED ORDER — PROPOFOL 10 MG/ML IV BOLUS
INTRAVENOUS | Status: DC | PRN
  Administered 2019-04-30: 200 mg via INTRAVENOUS

## 2019-04-30 MED ORDER — ONDANSETRON HCL 4 MG/2ML IJ SOLN
INTRAMUSCULAR | Status: AC
  Filled 2019-04-30: qty 2

## 2019-04-30 MED ORDER — ONDANSETRON HCL 4 MG PO TABS: 4.0000 mg | ORAL_TABLET | Freq: Four times a day (QID) | ORAL | Status: DC | PRN

## 2019-04-30 MED ORDER — POLYETHYLENE GLYCOL 3350 17 G PO PACK
17.0000 g | PACK | Freq: Every day | ORAL | Status: DC
  Administered 2019-05-01: 09:00:00 17 g via ORAL
  Filled 2019-04-30: qty 1

## 2019-04-30 MED ORDER — LIDOCAINE 2% (20 MG/ML) 5 ML SYRINGE
INTRAMUSCULAR | Status: AC
  Filled 2019-04-30: qty 5

## 2019-04-30 MED ORDER — DEXAMETHASONE SODIUM PHOSPHATE 10 MG/ML IJ SOLN
INTRAMUSCULAR | Status: DC | PRN
  Administered 2019-04-30: 10 mg via INTRAVENOUS

## 2019-04-30 MED ORDER — METOCLOPRAMIDE HCL 5 MG/ML IJ SOLN: 5.0000 mg | Freq: Three times a day (TID) | INTRAMUSCULAR | Status: DC | PRN

## 2019-04-30 MED ORDER — ROCURONIUM BROMIDE 100 MG/10ML IV SOLN
INTRAVENOUS | Status: DC | PRN
  Administered 2019-04-30 (×3): 20 mg via INTRAVENOUS
  Administered 2019-04-30: 80 mg via INTRAVENOUS
  Administered 2019-04-30 (×3): 20 mg via INTRAVENOUS

## 2019-04-30 MED ORDER — METOCLOPRAMIDE HCL 5 MG PO TABS: 5.0000 mg | ORAL_TABLET | Freq: Three times a day (TID) | ORAL | Status: DC | PRN

## 2019-04-30 MED ORDER — LACTATED RINGERS IV SOLN: INTRAVENOUS | Status: DC

## 2019-04-30 MED ORDER — DEXAMETHASONE SODIUM PHOSPHATE 10 MG/ML IJ SOLN
INTRAMUSCULAR | Status: DC | PRN
  Administered 2019-04-30: 5 mg

## 2019-04-30 MED ORDER — LACTATED RINGERS IV SOLN
INTRAVENOUS | Status: DC | PRN
  Administered 2019-04-30 (×2): via INTRAVENOUS

## 2019-04-30 MED ORDER — ROPIVACAINE HCL 5 MG/ML IJ SOLN
INTRAMUSCULAR | Status: DC | PRN
  Administered 2019-04-30: 40 mL via PERINEURAL

## 2019-04-30 MED ORDER — SUGAMMADEX SODIUM 200 MG/2ML IV SOLN
INTRAVENOUS | Status: DC | PRN
  Administered 2019-04-30: 200 mg via INTRAVENOUS

## 2019-04-30 SURGICAL SUPPLY — 99 items
BANDAGE ESMARK 6X9 LF (GAUZE/BANDAGES/DRESSINGS) ×2 IMPLANT
BIT DRILL 2.4X140 LONG SOLID (BIT) ×3 IMPLANT
BIT DRILL 2.5X2.75 QC CALB (BIT) ×3 IMPLANT
BIT DRILL 2.9 CANN QC NONSTRL (BIT) ×3 IMPLANT
BIT DRILL 3.5X5.5 QC CALB (BIT) ×3 IMPLANT
BLADE CLIPPER SURG (BLADE) ×3 IMPLANT
BNDG CMPR 9X6 STRL LF SNTH (GAUZE/BANDAGES/DRESSINGS) ×2
BNDG CMPR MED 10X6 ELC LF (GAUZE/BANDAGES/DRESSINGS) ×2
BNDG COHESIVE 4X5 TAN STRL (GAUZE/BANDAGES/DRESSINGS) IMPLANT
BNDG ELASTIC 4X5.8 VLCR STR LF (GAUZE/BANDAGES/DRESSINGS) ×3 IMPLANT
BNDG ELASTIC 6X10 VLCR STRL LF (GAUZE/BANDAGES/DRESSINGS) ×3 IMPLANT
BNDG ELASTIC 6X5.8 VLCR STR LF (GAUZE/BANDAGES/DRESSINGS) ×3 IMPLANT
BNDG ESMARK 6X9 LF (GAUZE/BANDAGES/DRESSINGS) ×3
BNDG GAUZE ELAST 4 BULKY (GAUZE/BANDAGES/DRESSINGS) ×6 IMPLANT
BONE CANC CHIPS 20CC PCAN1/4 (Bone Implant) ×3 IMPLANT
BRUSH SCRUB EZ PLAIN DRY (MISCELLANEOUS) ×6 IMPLANT
CANISTER WOUND CARE 500ML ATS (WOUND CARE) ×3 IMPLANT
CHIPS CANC BONE 20CC PCAN1/4 (Bone Implant) ×2 IMPLANT
COVER MAYO STAND STRL (DRAPES) ×3 IMPLANT
COVER SURGICAL LIGHT HANDLE (MISCELLANEOUS) ×6 IMPLANT
COVER WAND RF STERILE (DRAPES) ×3 IMPLANT
CUFF TOURN SGL QUICK 34 (TOURNIQUET CUFF) ×3
CUFF TRNQT CYL 34X4.125X (TOURNIQUET CUFF) ×2 IMPLANT
DRAPE C-ARM 42X72 X-RAY (DRAPES) ×3 IMPLANT
DRAPE C-ARMOR (DRAPES) ×3 IMPLANT
DRAPE HALF SHEET 40X57 (DRAPES) ×6 IMPLANT
DRAPE INCISE IOBAN 66X45 STRL (DRAPES) ×3 IMPLANT
DRAPE U-SHAPE 47X51 STRL (DRAPES) ×3 IMPLANT
DRSG ADAPTIC 3X8 NADH LF (GAUZE/BANDAGES/DRESSINGS) ×3 IMPLANT
DRSG PAD ABDOMINAL 8X10 ST (GAUZE/BANDAGES/DRESSINGS) ×12 IMPLANT
ELECT REM PT RETURN 9FT ADLT (ELECTROSURGICAL) ×3
ELECTRODE REM PT RTRN 9FT ADLT (ELECTROSURGICAL) ×2 IMPLANT
GAUZE SPONGE 4X4 12PLY STRL (GAUZE/BANDAGES/DRESSINGS) ×3 IMPLANT
GAUZE SPONGE 4X4 12PLY STRL LF (GAUZE/BANDAGES/DRESSINGS) ×3 IMPLANT
GLOVE BIO SURGEON STRL SZ7.5 (GLOVE) ×9 IMPLANT
GLOVE BIO SURGEON STRL SZ8 (GLOVE) ×12 IMPLANT
GLOVE BIOGEL PI IND STRL 7.0 (GLOVE) ×2 IMPLANT
GLOVE BIOGEL PI IND STRL 7.5 (GLOVE) ×2 IMPLANT
GLOVE BIOGEL PI IND STRL 8 (GLOVE) ×2 IMPLANT
GLOVE BIOGEL PI IND STRL 8.5 (GLOVE) ×2 IMPLANT
GLOVE BIOGEL PI INDICATOR 7.0 (GLOVE) ×1
GLOVE BIOGEL PI INDICATOR 7.5 (GLOVE) ×1
GLOVE BIOGEL PI INDICATOR 8 (GLOVE) ×1
GLOVE BIOGEL PI INDICATOR 8.5 (GLOVE) ×1
GLOVE ECLIPSE 8.0 STRL XLNG CF (GLOVE) ×3 IMPLANT
GLOVE PROGUARD SZ 7 1/2 (GLOVE) ×3 IMPLANT
GLOVE SURG EUDERMIC 8 LTX PF (GLOVE) ×3 IMPLANT
GLOVE SURG SS PI 6.5 STRL IVOR (GLOVE) ×3 IMPLANT
GOWN STRL REUS W/ TWL LRG LVL3 (GOWN DISPOSABLE) ×6 IMPLANT
GOWN STRL REUS W/ TWL XL LVL3 (GOWN DISPOSABLE) ×4 IMPLANT
GOWN STRL REUS W/TWL LRG LVL3 (GOWN DISPOSABLE) ×9
GOWN STRL REUS W/TWL XL LVL3 (GOWN DISPOSABLE) ×4
K-WIRE ACE 1.6X6 (WIRE) ×24
K-WIRE PLA 9 .062 (WIRE) ×3 IMPLANT
KIT BASIN OR (CUSTOM PROCEDURE TRAY) ×3 IMPLANT
KIT INFUSE LRG II (Orthopedic Implant) ×3 IMPLANT
KIT TURNOVER KIT B (KITS) ×3 IMPLANT
KWIRE ACE 1.6X6 (WIRE) ×16 IMPLANT
MANIFOLD NEPTUNE II (INSTRUMENTS) ×3 IMPLANT
NS IRRIG 1000ML POUR BTL (IV SOLUTION) ×3 IMPLANT
PACK ORTHO EXTREMITY (CUSTOM PROCEDURE TRAY) ×3 IMPLANT
PAD ABD 8X10 STRL (GAUZE/BANDAGES/DRESSINGS) ×3 IMPLANT
PAD ARMBOARD 7.5X6 YLW CONV (MISCELLANEOUS) ×6 IMPLANT
PAD CAST 4YDX4 CTTN HI CHSV (CAST SUPPLIES) ×2 IMPLANT
PADDING CAST COTTON 4X4 STRL (CAST SUPPLIES) ×1
PADDING CAST COTTON 6X4 STRL (CAST SUPPLIES) ×3 IMPLANT
PLATE DIST TIB PL 8H RT (Plate) ×3 IMPLANT
PLATE MEDIAL MALLEOLUS 4H HOOK (Plate) ×3 IMPLANT
PREVENA RESTOR AXIOFORM 29X28 (GAUZE/BANDAGES/DRESSINGS) ×3 IMPLANT
SCREW 3.5X32 NONLOCKING (Screw) ×3 IMPLANT
SCREW ACE CAN 4.0 36M (Screw) ×6 IMPLANT
SCREW LOCK PLATE R3 3.5X10 (Screw) ×3 IMPLANT
SCREW NL PLATE 3.5X50 (Screw) ×6 IMPLANT
SCREW NLOCK PLATE RECON 3.5X48 (Screw) ×6 IMPLANT
SCREW R3CON N/L PLATE 3.5X44 (Screw) ×3 IMPLANT
SCREW R3CON NONLOCK 3.5X38 (Screw) ×3 IMPLANT
SPONGE LAP 18X18 RF (DISPOSABLE) ×3 IMPLANT
STAPLER VISISTAT 35W (STAPLE) ×3 IMPLANT
STOCKINETTE IMPERVIOUS LG (DRAPES) IMPLANT
SUCTION FRAZIER HANDLE 10FR (MISCELLANEOUS) ×1
SUCTION TUBE FRAZIER 10FR DISP (MISCELLANEOUS) ×2 IMPLANT
SUT ETHILON 2 0 PSLX (SUTURE) ×6 IMPLANT
SUT ETHILON 3 0 PS 1 (SUTURE) IMPLANT
SUT FIBERWIRE 2-0 18 17.9 3/8 (SUTURE) ×3
SUT PDS AB 2-0 CT1 27 (SUTURE) ×3 IMPLANT
SUT VIC AB 0 CT1 27 (SUTURE) ×3
SUT VIC AB 0 CT1 27XBRD ANBCTR (SUTURE) ×2 IMPLANT
SUT VIC AB 1 CT1 27 (SUTURE) ×3
SUT VIC AB 1 CT1 27XBRD ANBCTR (SUTURE) ×2 IMPLANT
SUT VIC AB 2-0 CT1 27 (SUTURE) ×12
SUT VIC AB 2-0 CT1 TAPERPNT 27 (SUTURE) ×8 IMPLANT
SUTURE FIBERWR 2-0 18 17.9 3/8 (SUTURE) ×2 IMPLANT
TOWEL GREEN STERILE (TOWEL DISPOSABLE) ×6 IMPLANT
TOWEL GREEN STERILE FF (TOWEL DISPOSABLE) ×6 IMPLANT
TRAY FOLEY MTR SLVR 16FR STAT (SET/KITS/TRAYS/PACK) IMPLANT
TUBE CONNECTING 12X1/4 (SUCTIONS) ×3 IMPLANT
UNDERPAD 30X30 (UNDERPADS AND DIAPERS) ×6 IMPLANT
WATER STERILE IRR 1000ML POUR (IV SOLUTION) ×6 IMPLANT
YANKAUER SUCT BULB TIP NO VENT (SUCTIONS) ×3 IMPLANT

## 2019-04-30 NOTE — Anesthesia Procedure Notes (Signed)
Anesthesia Regional Block: Adductor canal block   Pre-Anesthetic Checklist: ,, timeout performed, Correct Patient, Correct Site, Correct Laterality, Correct Procedure, Correct Position, site marked, Risks and benefits discussed,  Surgical consent,  Pre-op evaluation,  At surgeon's request and post-op pain management  Laterality: Right  Prep: chloraprep       Needles:  Injection technique: Single-shot  Needle Type: Echogenic Stimulator Needle     Needle Length: 10cm  Needle Gauge: 21     Additional Needles:   Procedures:,,,, ultrasound used (permanent image in chart),,,,  Narrative:  Start time: 04/30/2019 7:20 AM End time: 04/30/2019 7:31 AM Injection made incrementally with aspirations every 5 mL.  Performed by: Personally  Anesthesiologist: Lidia Collum, MD  Additional Notes: Monitors applied. Injection made in 5cc increments. No resistance to injection. Good needle visualization. Patient tolerated procedure well.

## 2019-04-30 NOTE — OR Nursing (Signed)
Dr. Marcelino Scot removed five external fixation pins from right lower leg and right foot with the procedure removal of External fixator.   Two pins were in anterior mid tibia, one in metatarsal lateral, one in metatarsal medial and one out of calcaneous that extended lateral and medial.

## 2019-04-30 NOTE — Anesthesia Procedure Notes (Signed)
Procedure Name: Intubation Date/Time: 04/30/2019 8:26 AM Performed by: Candis Shine, CRNA Pre-anesthesia Checklist: Patient identified, Emergency Drugs available, Suction available and Patient being monitored Patient Re-evaluated:Patient Re-evaluated prior to induction Oxygen Delivery Method: Circle System Utilized Preoxygenation: Pre-oxygenation with 100% oxygen Induction Type: IV induction Ventilation: Mask ventilation without difficulty Laryngoscope Size: Mac and 4 Grade View: Grade I Tube type: Oral Tube size: 7.5 mm Number of attempts: 1 Airway Equipment and Method: Stylet Placement Confirmation: ETT inserted through vocal cords under direct vision,  positive ETCO2 and breath sounds checked- equal and bilateral Secured at: 23 cm Tube secured with: Tape Dental Injury: Teeth and Oropharynx as per pre-operative assessment
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
## 2019-04-30 NOTE — Progress Notes (Signed)
Labs not drawn in Pre-op . Verbal ok from Anesth. MD to draw labs in the OR .
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
## 2019-04-30 NOTE — Brief Op Note (Signed)
04/30/2019  1:54 PM  PATIENT:  Joel Garner  33 y.o. male  PRE-OPERATIVE DIAGNOSIS:   1. RIGHT PILON FRACTURE 2. RIGHT FIBULA NONUNION 3. RETAINED EXTERNAL FIXATOR 4. ULCERATED PIN SITES HEEL AND TIBIA  POST-OPERATIVE DIAGNOSIS:  1. RIGHT PILON FRACTURE 2. RIGHT FIBULA NONUNION 3. RETAINED EXTERNAL FIXATOR 4. ULCERATED PIN SITES HEEL AND TIBIA  PROCEDURE:  Procedure(s): 1. OPEN REDUCTION INTERNAL FIXATION (ORIF) PILON FRACTURE (Right) 2. REPAIR OF RIGHT FIBULA NONUNION 3. REMOVAL EXTERNAL FIXATION LEG (Right) 4. CURETTAGE OF PIN SITES HEEL AND TIBIA 5. REPAIR OF PARTIAL POSTERIOR TIBIAL TENDON LACERATION 6. Application Of Wound Vac (Right)  SURGEON:  Surgeon(s) and Role:    Altamese Gates, MD - Primary  PHYSICIAN ASSISTANT: Ainsley Spinner, PA-C  ANESTHESIA:   general WITH regional block  EBL:  150 mL   BLOOD ADMINISTERED:none  DRAINS: none   LOCAL MEDICATIONS USED:  NONE  SPECIMEN:  No Specimen  DISPOSITION OF SPECIMEN:  N/A  COUNTS:  YES  TOURNIQUET:   Total Tourniquet Time Documented: Thigh (Right) - 76 minutes Total: Thigh (Right) - 76 minutes   DICTATION: .Other Dictation: Dictation Number [id number]  PLAN OF CARE: Admit to inpatient   PATIENT DISPOSITION:  PACU - hemodynamically stable.   Delay start of Pharmacological VTE agent (>24hrs) due to surgical blood loss or risk of bleeding: no

## 2019-04-30 NOTE — Transfer of Care (Signed)
Immediate Anesthesia Transfer of Care Note  Patient: Joel Garner  Procedure(s) Performed: OPEN REDUCTION INTERNAL FIXATION (ORIF) PILON FRACTURE (Right ) REMOVAL EXTERNAL FIXATION LEG (Right ) Application Of Wound Vac (Right Ankle)  Patient Location: PACU  Anesthesia Type:General  Level of Consciousness: awake, alert  and oriented  Airway & Oxygen Therapy: Patient Spontanous Breathing and Patient connected to face mask oxygen  Post-op Assessment: Report given to RN and Post -op Vital signs reviewed and stable  Post vital signs: Reviewed and stable  Last Vitals:  Vitals Value Taken Time  BP 142/85 04/30/19 1327  Temp    Pulse    Resp 20 04/30/19 1328  SpO2    Vitals shown include unvalidated device data.  Last Pain:  Vitals:   04/30/19 0741  TempSrc:   PainSc: 0-No pain      Patients Stated Pain Goal: 3 (62/03/55 9741)  Complications: No apparent anesthesia complications

## 2019-04-30 NOTE — Anesthesia Preprocedure Evaluation (Addendum)
Anesthesia Evaluation  Patient identified by MRN, date of birth, ID band Patient awake    Reviewed: Allergy & Precautions, NPO status , Patient's Chart, lab work & pertinent test results  History of Anesthesia Complications Negative for: history of anesthetic complications  Airway Mallampati: II  TM Distance: >3 FB Neck ROM: Full    Dental  (+) Teeth Intact   Pulmonary former smoker,    Pulmonary exam normal        Cardiovascular negative cardio ROS Normal cardiovascular exam     Neuro/Psych negative neurological ROS     GI/Hepatic negative GI ROS, Neg liver ROS,   Endo/Other  negative endocrine ROS  Renal/GU negative Renal ROS     Musculoskeletal negative musculoskeletal ROS (+)   Abdominal   Peds  Hematology negative hematology ROS (+)   Anesthesia Other Findings Right ankle fracture  Reproductive/Obstetrics                            Anesthesia Physical  Anesthesia Plan  ASA: I  Anesthesia Plan: General   Post-op Pain Management: GA combined w/ Regional for post-op pain   Induction: Intravenous  PONV Risk Score and Plan: 3 and Treatment may vary due to age or medical condition, Ondansetron, Dexamethasone and Midazolam  Airway Management Planned: Oral ETT  Additional Equipment: None  Intra-op Plan:   Post-operative Plan: Extubation in OR  Informed Consent: I have reviewed the patients History and Physical, chart, labs and discussed the procedure including the risks, benefits and alternatives for the proposed anesthesia with the patient or authorized representative who has indicated his/her understanding and acceptance.     Dental advisory given  Plan Discussed with:   Anesthesia Plan Comments:         Anesthesia Quick Evaluation

## 2019-04-30 NOTE — Anesthesia Procedure Notes (Signed)
Anesthesia Regional Block: Popliteal block   Pre-Anesthetic Checklist: ,, timeout performed, Correct Patient, Correct Site, Correct Laterality, Correct Procedure, Correct Position, site marked, Risks and benefits discussed,  Surgical consent,  Pre-op evaluation,  At surgeon's request and post-op pain management  Laterality: Right  Prep: chloraprep       Needles:  Injection technique: Single-shot  Needle Type: Echogenic Stimulator Needle     Needle Length: 10cm  Needle Gauge: 21     Additional Needles:   Procedures:,,,, ultrasound used (permanent image in chart),,,,  Narrative:  Start time: 04/30/2019 7:20 AM End time: 04/30/2019 7:31 AM Injection made incrementally with aspirations every 5 mL.  Performed by: Personally  Anesthesiologist: Lidia Collum, MD  Additional Notes: Monitors applied. Injection made in 5cc increments. No resistance to injection. Good needle visualization. Patient tolerated procedure well.

## 2019-05-01 ENCOUNTER — Encounter (HOSPITAL_COMMUNITY): Payer: Self-pay | Admitting: Orthopedic Surgery

## 2019-05-01 LAB — CBC
HCT: 33.3 % — ABNORMAL LOW (ref 39.0–52.0)
Hemoglobin: 11.1 g/dL — ABNORMAL LOW (ref 13.0–17.0)
MCH: 27.6 pg (ref 26.0–34.0)
MCHC: 33.3 g/dL (ref 30.0–36.0)
MCV: 82.8 fL (ref 80.0–100.0)
Platelets: 223 10*3/uL (ref 150–400)
RBC: 4.02 MIL/uL — ABNORMAL LOW (ref 4.22–5.81)
RDW: 12 % (ref 11.5–15.5)
WBC: 15.1 10*3/uL — ABNORMAL HIGH (ref 4.0–10.5)
nRBC: 0 % (ref 0.0–0.2)

## 2019-05-01 LAB — VITAMIN D 25 HYDROXY (VIT D DEFICIENCY, FRACTURES): Vit D, 25-Hydroxy: 37.41 ng/mL (ref 30–100)

## 2019-05-01 MED ORDER — OXYCODONE-ACETAMINOPHEN 7.5-325 MG PO TABS: 1.0000 | ORAL_TABLET | Freq: Four times a day (QID) | ORAL | 0 refills | Status: DC | PRN

## 2019-05-01 MED ORDER — ACETAMINOPHEN 500 MG PO TABS: 500.0000 mg | ORAL_TABLET | Freq: Two times a day (BID) | ORAL | 0 refills | Status: DC

## 2019-05-01 MED ORDER — SHARPS CONTAINER MISC: 1.0000 | 0 refills | Status: DC | PRN

## 2019-05-01 MED ORDER — METHOCARBAMOL 500 MG PO TABS: 500.0000 mg | ORAL_TABLET | Freq: Three times a day (TID) | ORAL | 0 refills | Status: DC | PRN

## 2019-05-01 MED ORDER — ENOXAPARIN SODIUM 40 MG/0.4ML ~~LOC~~ SOLN: 40.0000 mg | SUBCUTANEOUS | 0 refills | Status: AC

## 2019-05-01 MED FILL — ENOXAPARIN SODIUM 40 MG/0.4: 40 | 14 days supply | Qty: 6 | Fill #0

## 2019-05-01 MED FILL — ACETAMINOPHEN EXTRA STRENGT: 500 | 30 days supply | Qty: 60 | Fill #0

## 2019-05-01 MED FILL — METHOCARBAMOL 500 MG TABS: 500 | 10 days supply | Qty: 60 | Fill #0

## 2019-05-01 MED FILL — OXYCODON-ACETAMINOPHEN 7.5-: 7.5-325 | 7 days supply | Qty: 56 | Fill #0

## 2019-05-01 NOTE — Care Management (Signed)
CM consult acknowledged for HH/DME needs. PT eval completed with patient cleared to transition home with crutches/no HH. CM met with patient to discuss transitional needs, with no needs reports. Patient reports having transportation home with no medication needs. No further needs from CM.  Midge Minium RN, BSN, NCM-BC, ACM-RN [phone]

## 2019-05-01 NOTE — Discharge Instructions (Signed)
[facility] Discharge Instructions   General Discharge Instructions  WEIGHT BEARING STATUS: Nonweightbearing Right leg  RANGE OF MOTION/ACTIVITY: ok to move toes and knee on right side. Activity as tolerated while maintaining weightbearing restrictions.    Wound Care: do not remove splint. Do not get splint wet. We will remove splint at first office visit in 10-14 days.  Make sure prevena (wound vac) has a good battery charge before leaving the house. Ok to leave unit unplugged during the day.  Recommend plugging in at night or when the unit needs a charge   DVT/PE prophylaxis: Lovenox 40 mg subcutaneous injection daily x 14 days   Diet: as you were eating previously.  Can use over the counter stool softeners and bowel preparations, such as Miralax, to help with bowel movements.  Narcotics can be constipating.  Be sure to drink plenty of fluids  PAIN MEDICATION USE AND EXPECTATIONS  You have likely been given narcotic medications to help control your pain.  After a traumatic event that results in an fracture (broken bone) with or without surgery, it is ok to use narcotic pain medications to help control one's pain.  We understand that everyone responds to pain differently and each individual patient will be evaluated on a regular basis for the continued need for narcotic medications. Ideally, narcotic medication use should last no more than 6-8 weeks (coinciding with fracture healing).   As a patient it is your responsibility as well to monitor narcotic medication use and report the amount and frequency you use these medications when you come to your office visit.   We would also advise that if you are using narcotic medications, you should take a dose prior to therapy to maximize you participation.  IF YOU ARE ON NARCOTIC MEDICATIONS IT IS NOT PERMISSIBLE TO OPERATE A MOTOR VEHICLE (MOTORCYCLE/CAR/TRUCK/MOPED) OR HEAVY MACHINERY DO NOT MIX NARCOTICS WITH OTHER CNS (CENTRAL  NERVOUS SYSTEM) DEPRESSANTS SUCH AS ALCOHOL   STOP SMOKING OR USING NICOTINE PRODUCTS!!!!  As discussed nicotine severely impairs your body's ability to heal surgical and traumatic wounds but also impairs bone healing.  Wounds and bone heal by forming microscopic blood vessels (angiogenesis) and nicotine is a vasoconstrictor (essentially, shrinks blood vessels).  Therefore, if vasoconstriction occurs to these microscopic blood vessels they essentially disappear and are unable to deliver necessary nutrients to the healing tissue.  This is one modifiable factor that you can do to dramatically increase your chances of healing your injury.    (This means no smoking, no nicotine gum, patches, etc)  DO NOT USE NONSTEROIDAL ANTI-INFLAMMATORY DRUGS (NSAID'S)  Using products such as Advil (ibuprofen), Aleve (naproxen), Motrin (ibuprofen) for additional pain control during fracture healing can delay and/or prevent the healing response.  If you would like to take over the counter (OTC) medication, Tylenol (acetaminophen) is ok.  However, some narcotic medications that are given for pain control contain acetaminophen as well. Therefore, you should not exceed more than 4000 mg of tylenol in a day if you do not have liver disease.  Also note that there are may OTC medicines, such as cold medicines and allergy medicines that my contain tylenol as well.  If you have any questions about medications and/or interactions please ask your doctor/PA or your pharmacist.      ICE AND ELEVATE INJURED/OPERATIVE EXTREMITY  Using ice and elevating the injured extremity above your heart can help with swelling and pain control.  Icing in a pulsatile fashion, such as 20 minutes  on and 20 minutes off, can be followed.    Do not place ice directly on skin. Make sure there is a barrier between to skin and the ice pack.    Using frozen items such as frozen peas works well as the conform nicely to the are that needs to be iced.  USE AN  ACE WRAP OR TED HOSE FOR SWELLING CONTROL  In addition to icing and elevation, Ace wraps or TED hose are used to help limit and resolve swelling.  It is recommended to use Ace wraps or TED hose until you are informed to stop.    When using Ace Wraps start the wrapping distally (farthest away from the body) and wrap proximally (closer to the body)   Example: If you had surgery on your leg or thing and you do not have a splint on, start the ace wrap at the toes and work your way up to the thigh        If you had surgery on your upper extremity and do not have a splint on, start the ace wrap at your fingers and work your way up to the upper arm  IF YOU ARE IN A SPLINT OR CAST DO NOT [place]   If your splint gets wet for any reason please contact the office immediately. You may shower in your splint or cast as long as you keep it dry.  This can be done by wrapping in a cast cover or garbage back (or similar)  Do Not stick any thing down your splint or cast such as pencils, money, or hangers to try and scratch yourself with.  If you feel itchy take benadryl as prescribed on the bottle for itching  IF YOU ARE IN A CAM BOOT (BLACK BOOT)  You may remove boot periodically. Perform daily dressing changes as noted below.  Wash the liner of the boot regularly and wear a sock when wearing the boot. It is recommended that you sleep in the boot until told otherwise    Call office for the following:  Temperature greater than 101F  Persistent nausea and vomiting  Severe uncontrolled pain  Redness, tenderness, or signs of infection (pain, swelling, redness, odor or green/yellow discharge around the site)  Difficulty breathing, headache or visual disturbances  Hives  Persistent dizziness or light-headedness  Extreme fatigue  Any other questions or concerns you may have after discharge  In an emergency, call 911 or go to an Emergency Department at a nearby hospital    [facility]: [phone]   VISIT OUR WEBSITE FOR ADDITIONAL INFORMATION: [email or web address]

## 2019-05-01 NOTE — Progress Notes (Signed)
OT Cancellation Note  Patient Details Name: Joel Garner MRN: [id number] DOB: February 03, 1986   Cancelled Treatment:    Reason Eval/Treat Not Completed: OT screened, no needs identified, will sign off   Spoke with pt- did not feel he had any OT needs.  Kari Baars, OT Acute Rehabilitation Services [phone] Office- [phone], Edwena Felty D 05/01/2019, 2:21 PM

## 2019-05-01 NOTE — Progress Notes (Signed)
Pt stated Mom was a nurse and could do his lovenox shots and also stated that he did not need crutches due to having some at home. Pt d/c via private auto with family to home. All questions asked.
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
## 2019-05-01 NOTE — Progress Notes (Signed)
Orthopaedic Trauma Service (OTS)  1 Day Post-Op Procedure(s) (LRB): OPEN REDUCTION INTERNAL FIXATION (ORIF) PILON FRACTURE (Right) REMOVAL EXTERNAL FIXATION LEG (Right) Application Of Wound Vac (Right)  Subjective: Patient reports pain as 7 on 0-10 scale.  Still wants to try to go home this pm.  Objective: Current Vitals Blood pressure 115/67, pulse 81, temperature 98.8 F (37.1 C), temperature source Oral, resp. rate 16, height 6\' 1"  (1.854 m), weight 93 kg, SpO2 100 %. Vital signs in last 24 hours: Temp:  [97 F (36.1 C)-99.1 F (37.3 C)] 98.8 F (37.1 C) (09/30 0405) Pulse Rate:  [75-89] 81 (09/30 0405) Resp:  [13-23] 16 (09/30 0405) BP: (115-142)/(67-89) 115/67 (09/30 0405) SpO2:  [92 %-100 %] 100 % (09/30 0405)  Intake/Output from previous day: 09/29 0701 - 09/30 0700 In: 2005.5 [I.V.:1952.2; IV Piggyback:53.3] Out: 750 [Urine:600; Blood:150]  LABS Recent Labs    04/30/19 1352 05/01/19 0159  HGB 12.9* 11.1*   Recent Labs    04/30/19 1352 05/01/19 0159  WBC 4.9 15.1*  RBC 4.77 4.02*  HCT 40.9 33.3*  PLT 243 223   Recent Labs    04/30/19 1352  NA 139  K 4.2  CL 103  CO2 24  BUN 10  CREATININE 1.25*  GLUCOSE 145*  CALCIUM 9.2   Recent Labs    04/30/19 1352  INR 1.1     Physical Exam RLE Splint and dressing intact, clean, dry  Edema/ swelling controlled  Sens: DPN, SPN, TN intact  Motor: EHL, FHL, and lessor toe ext and flex all intact grossly but stiff  Brisk cap refill, warm to touch  Assessment/Plan: 1 Day Post-Op Procedure(s) (LRB): OPEN REDUCTION INTERNAL FIXATION (ORIF) PILON FRACTURE (Right) REMOVAL EXTERNAL FIXATION LEG (Right) Application Of Wound Vac (Right) 1. PT/OT  2. DVT proph Lovenox 3. F/u 7 days 4. Will need changeover to home vac cannister.  Altamese Fenton, MD [facility] [phone] (p)

## 2019-05-01 NOTE — Anesthesia Postprocedure Evaluation (Signed)
Anesthesia Post Note  Patient: Joel Garner  Procedure(s) Performed: OPEN REDUCTION INTERNAL FIXATION (ORIF) PILON FRACTURE (Right ) REMOVAL EXTERNAL FIXATION LEG (Right ) Application Of Wound Vac (Right Ankle)     Patient location during evaluation: PACU Anesthesia Type: General Level of consciousness: awake and alert Pain management: pain level controlled Vital Signs Assessment: post-procedure vital signs reviewed and stable Respiratory status: spontaneous breathing, nonlabored ventilation and respiratory function stable Cardiovascular status: blood pressure returned to baseline and stable Postop Assessment: no apparent nausea or vomiting Anesthetic complications: no    Last Vitals:  Vitals:   05/01/19 0018 05/01/19 0405  BP: 127/68 115/67  Pulse: 84 81  Resp: 20 16  Temp: 37 C 37.1 C  SpO2: 100% 100%    Last Pain:  Vitals:   05/01/19 0622  TempSrc:   PainSc: 8                  Lidia Collum

## 2019-05-14 ENCOUNTER — Encounter (HOSPITAL_COMMUNITY): Payer: Self-pay | Admitting: Orthopedic Surgery

## 2020-01-28 ENCOUNTER — Telehealth: Payer: Self-pay | Admitting: General Practice

## 2020-01-28 NOTE — Telephone Encounter (Signed)
Individual has been contacted 3+ times regarding ED referral and has been given information regarding the clinic. No further attempts to contact individual will be made. 
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
## 2021-04-21 IMAGING — DX RIGHT ANKLE - COMPLETE 3+ VIEW
1 series · 3 of 3 positions shown · non-contrast
Comparison: Portable exam 9829 hours compared to 03/28/2019

CLINICAL DATA: Post reduction RIGHT ankle

EXAM:
RIGHT ANKLE - COMPLETE 3+ VIEW

[Series 1: ankle · 0.14mm/px · 3 of 3 slices shown]
[im 1/3]
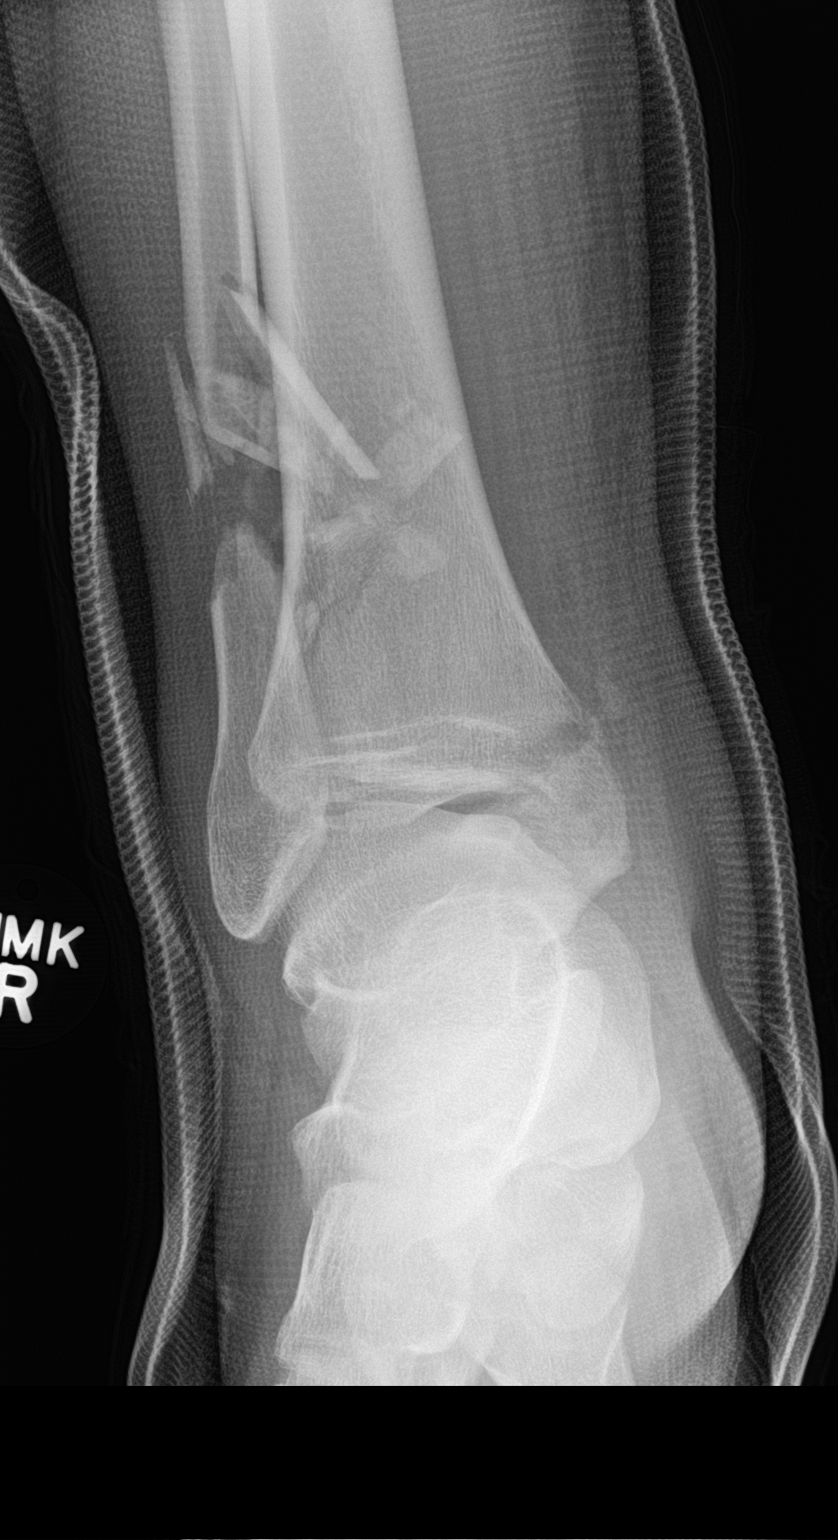
[im 2/3]
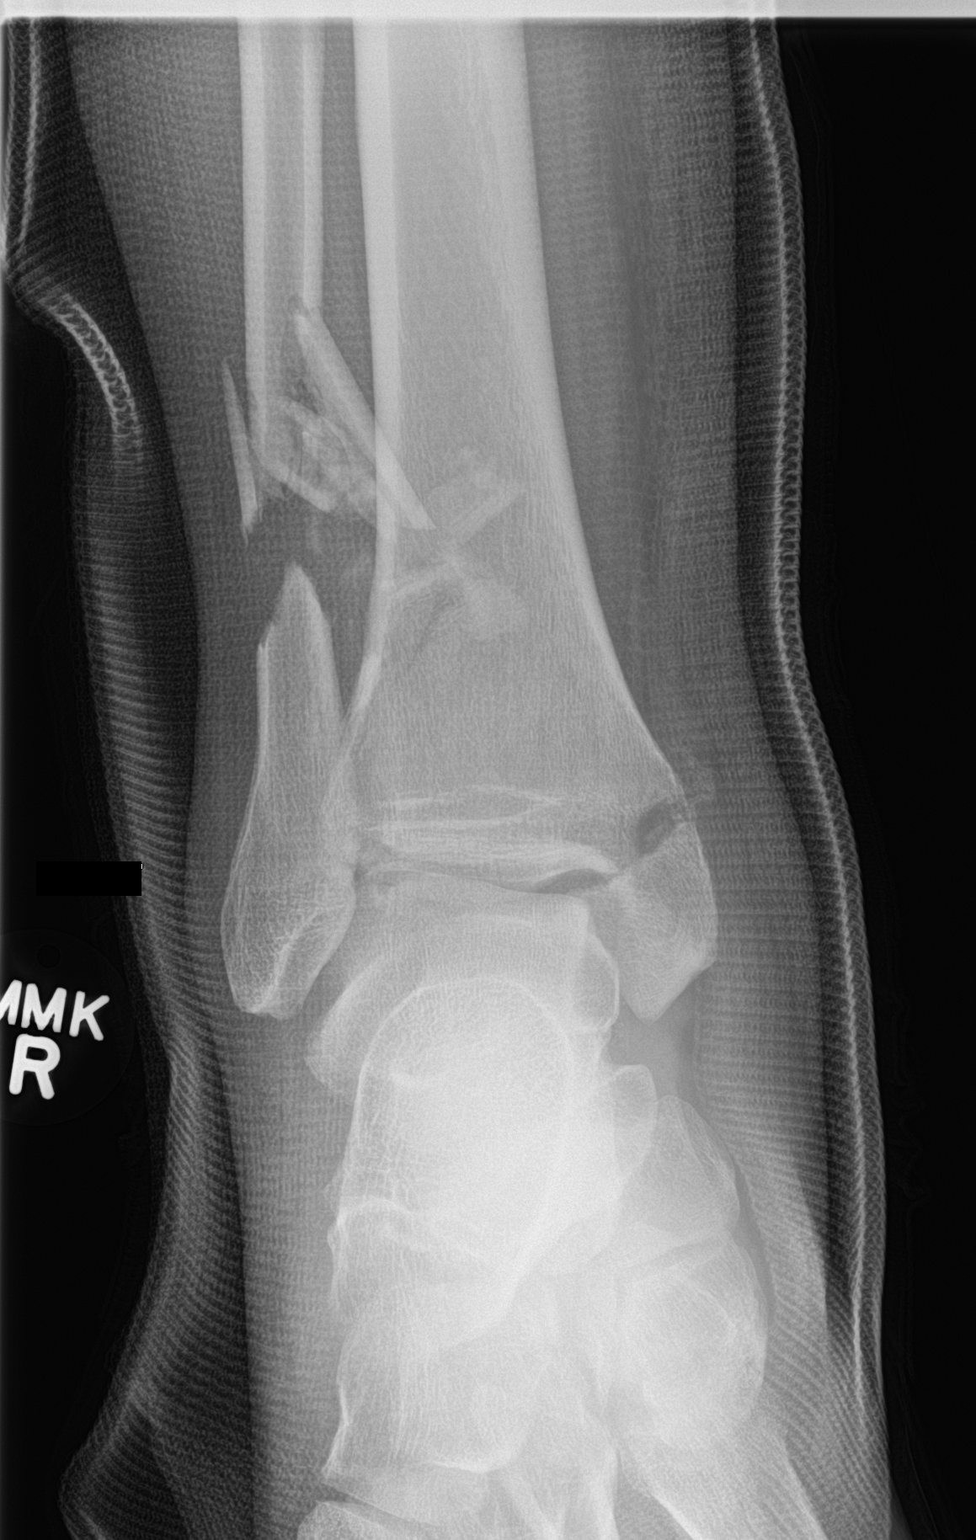
[im 3/3]
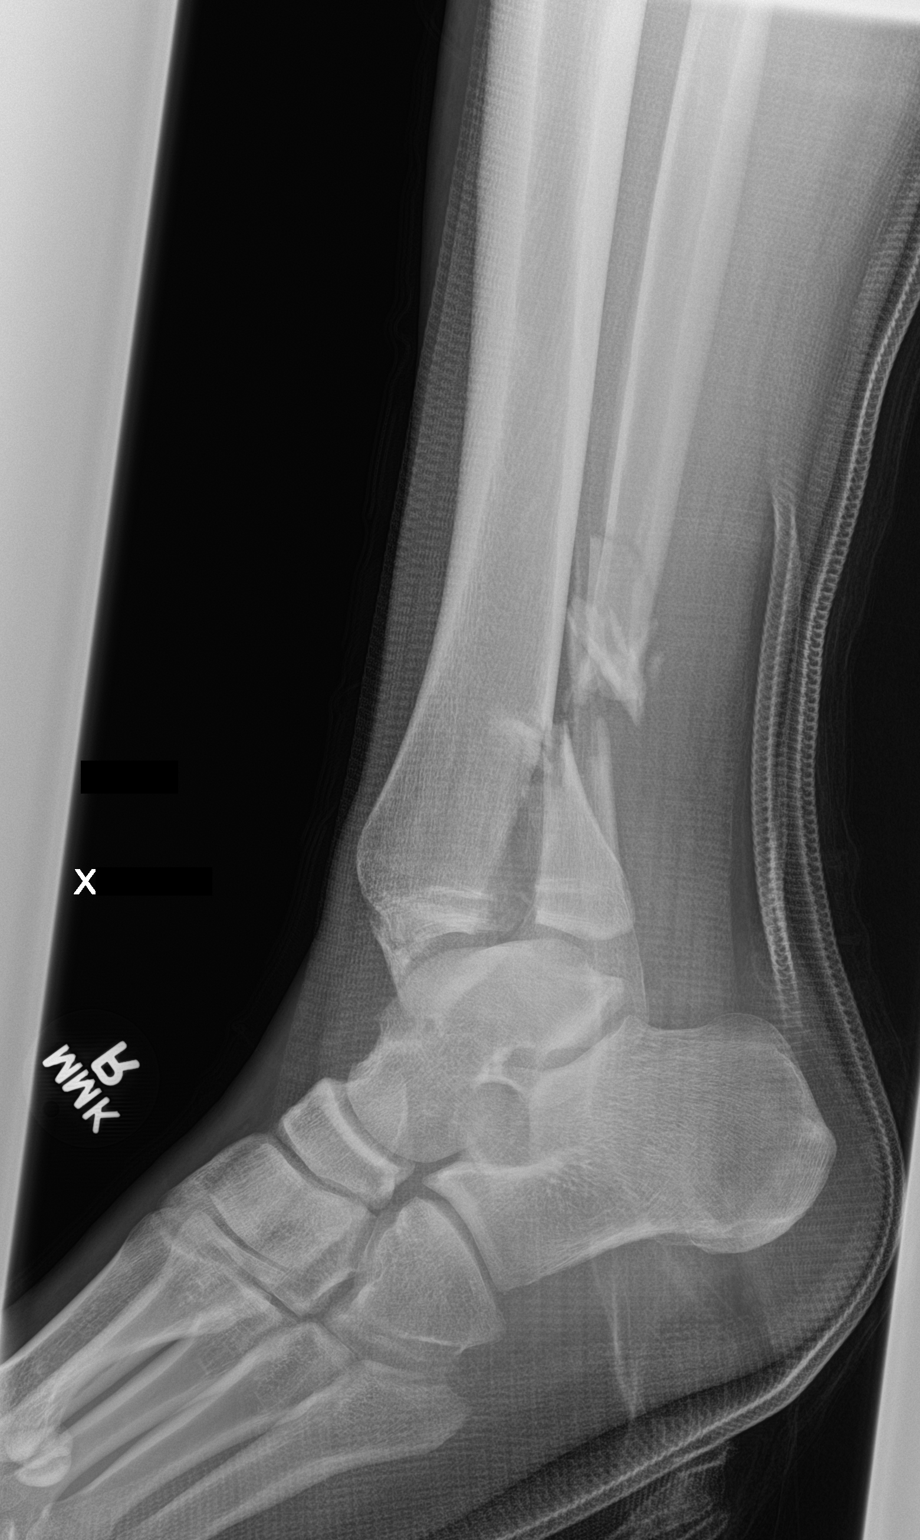

[3 of 3 positions shown; findings below may reference images not displayed]

FINDINGS: Fiberglass cast material obscures bone detail.

Osseous mineralization grossly normal.

Comminuted fracture of the distal RIGHT fibular diaphysis with
multiple displaced fragments and decreased angulation.

Transverse fracture of the medial malleolus, with significant
improvement in alignment.

Improved alignment of intra-articular posterior malleolar fracture
of the distal tibia.

No dislocation identified.

Slight widening of the medial joint line.

Talar fracture seen on the previous exam not visualized on current
study.
IMPRESSION: Multiple RIGHT ankle fractures demonstrate improved alignment since
earlier exam as above.
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
## 2021-04-21 IMAGING — RF DG C-ARM 1-60 MIN
1 series · 8 of 8 positions shown · non-contrast
Comparison: Right ankle radiographs 03/29/2019 at 5335 hours

FLUOROSCOPY TIME:  Fluoroscopy Time:  1 minute 3 seconds

CLINICAL DATA: Right ankle external fixation.

EXAM:
DG C-ARM 1-60 MIN; RIGHT ANKLE - COMPLETE 3+ VIEW

[Series 1: run · 8 of 8 slices shown]
[im 1/8]
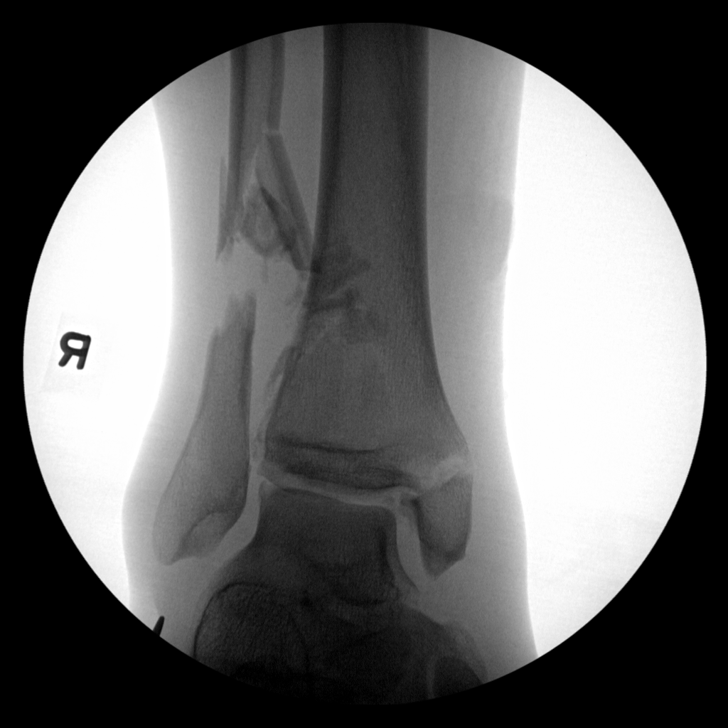
[im 2/8]
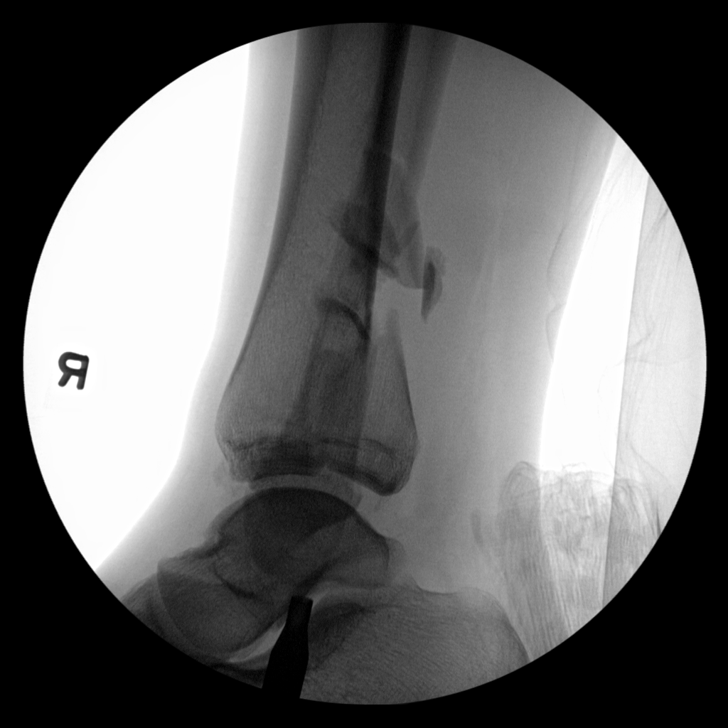
[im 3/8]
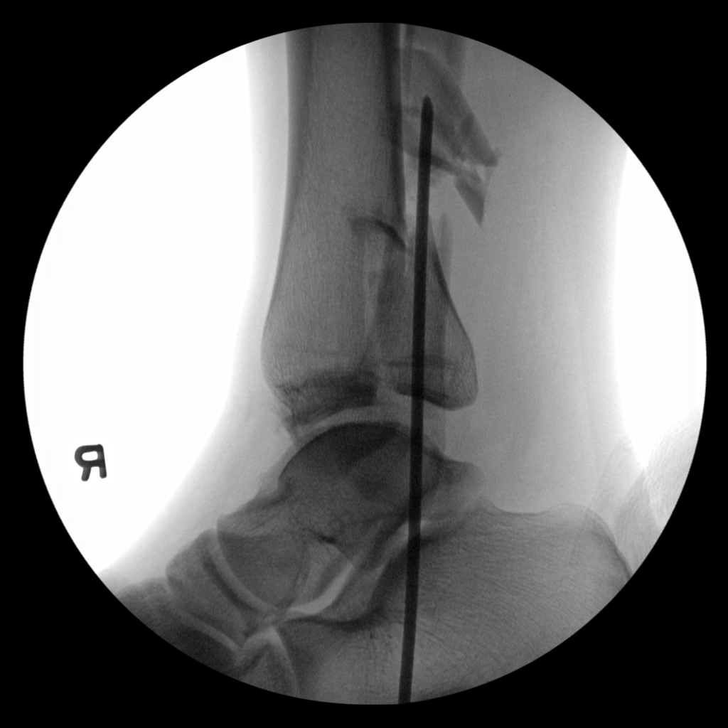
[im 4/8]
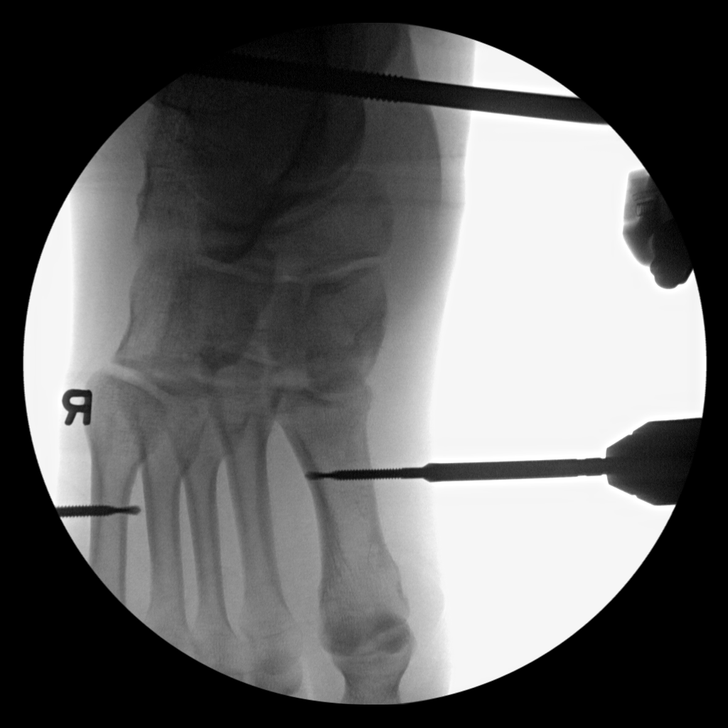
[im 5/8]
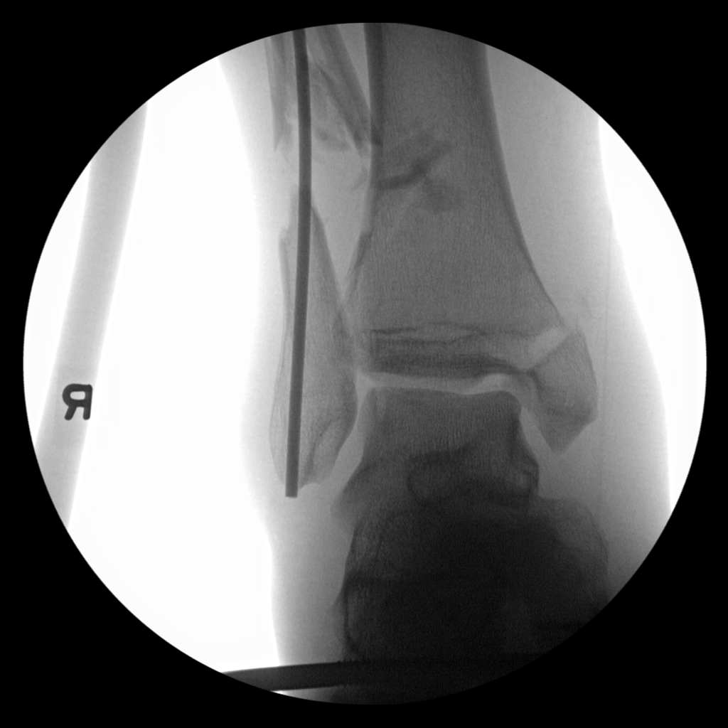
[im 6/8]
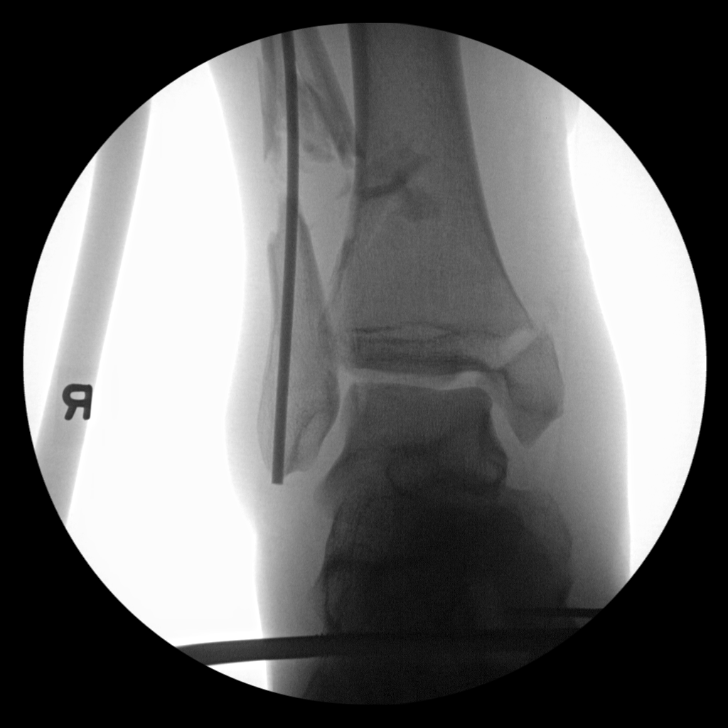
[im 7/8]
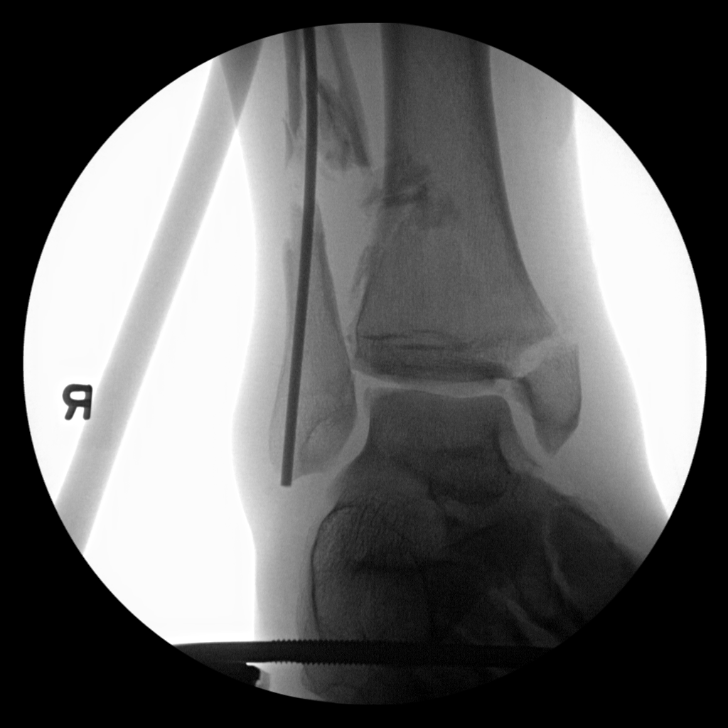
[im 8/8]
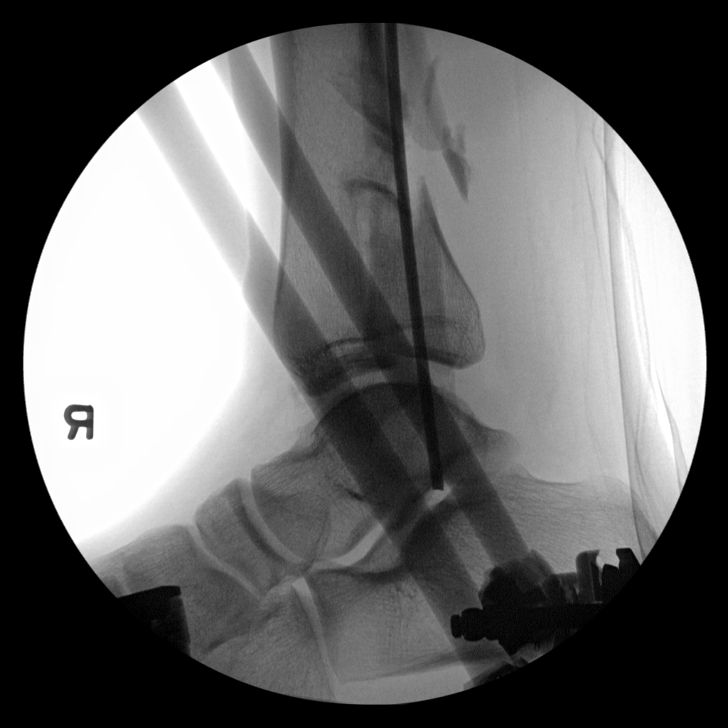

[8 of 8 positions shown; findings below may reference images not displayed]

FINDINGS: Eight intraoperative fluoroscopic images are provided. Initial
images again demonstrate a comminuted trimalleolar fracture with
subsequent images demonstrating placement of external fixation
hardware.
IMPRESSION: Intraoperative images during external fixation of trimalleolar
fracture.

## 2023-07-02 DEATH — deceased
